# Patient Record
Sex: Male | Born: 1985 | Race: Black or African American | Hispanic: No | Marital: Single | Smoking: Former smoker
Health system: Southern US, Community
[De-identification: ages and names within clinical notes are randomized; demographics above are authoritative.]

## PROBLEM LIST (undated history)

## (undated) HISTORY — PX: OTHER SURGICAL HISTORY: SHX169

---

## 2009-02-17 ENCOUNTER — Ambulatory Visit: Payer: Self-pay | Admitting: Radiology

## 2009-02-17 ENCOUNTER — Emergency Department (HOSPITAL_BASED_OUTPATIENT_CLINIC_OR_DEPARTMENT_OTHER): Admission: EM | Admit: 2009-02-17 | Discharge: 2009-02-17 | Payer: Self-pay | Admitting: Emergency Medicine

## 2009-05-17 ENCOUNTER — Emergency Department (HOSPITAL_BASED_OUTPATIENT_CLINIC_OR_DEPARTMENT_OTHER): Admission: EM | Admit: 2009-05-17 | Discharge: 2009-05-17 | Payer: Self-pay | Admitting: Emergency Medicine

## 2009-08-10 ENCOUNTER — Emergency Department (HOSPITAL_BASED_OUTPATIENT_CLINIC_OR_DEPARTMENT_OTHER): Admission: EM | Admit: 2009-08-10 | Discharge: 2009-08-10 | Payer: Self-pay | Admitting: Emergency Medicine

## 2009-08-10 ENCOUNTER — Ambulatory Visit: Payer: Self-pay | Admitting: Radiology

## 2009-12-02 ENCOUNTER — Ambulatory Visit: Payer: Self-pay | Admitting: Diagnostic Radiology

## 2009-12-02 ENCOUNTER — Emergency Department (HOSPITAL_BASED_OUTPATIENT_CLINIC_OR_DEPARTMENT_OTHER): Admission: EM | Admit: 2009-12-02 | Discharge: 2009-12-02 | Payer: Self-pay | Admitting: Emergency Medicine

## 2011-01-10 LAB — RAPID STREP SCREEN (MED CTR MEBANE ONLY): Streptococcus, Group A Screen (Direct): NEGATIVE

## 2015-09-22 ENCOUNTER — Emergency Department (HOSPITAL_BASED_OUTPATIENT_CLINIC_OR_DEPARTMENT_OTHER)
Admission: EM | Admit: 2015-09-22 | Discharge: 2015-09-22 | Discharge status: Against Medical Advice | Payer: Self-pay | Attending: Emergency Medicine | Admitting: Emergency Medicine

## 2015-09-22 ENCOUNTER — Encounter (HOSPITAL_BASED_OUTPATIENT_CLINIC_OR_DEPARTMENT_OTHER): Payer: Self-pay | Admitting: *Deleted

## 2015-09-22 DIAGNOSIS — F17200 Nicotine dependence, unspecified, uncomplicated: Secondary | ICD-10-CM | POA: Insufficient documentation

## 2015-09-22 DIAGNOSIS — Z5321 Procedure and treatment not carried out due to patient leaving prior to being seen by health care provider: Secondary | ICD-10-CM | POA: Insufficient documentation

## 2015-09-22 LAB — URINALYSIS, ROUTINE W REFLEX MICROSCOPIC
Bilirubin Urine: NEGATIVE
Glucose, UA: NEGATIVE mg/dL
Hgb urine dipstick: NEGATIVE
KETONES UR: NEGATIVE mg/dL
LEUKOCYTES UA: NEGATIVE
NITRITE: NEGATIVE
PROTEIN: NEGATIVE mg/dL
Specific Gravity, Urine: 1.021 (ref 1.005–1.030)
pH: 6 (ref 5.0–8.0)

## 2015-09-22 NOTE — ED Notes (Signed)
Back pain x 2 days. Was seen at [facility] yesterday and told he may have a sprained back.

## 2015-09-22 NOTE — ED Notes (Signed)
Pt stated he needed to get a phone charger from his car and would come right back. Approx 15 min later he has not returned. Staff looked for him in the lobby and parking lot, and he was not present. Pt is assumed to have LWBS after triage. Pt with alert, interactive, breathing easy and unlabored, ambulated to the exit quickly with steady gait in NAD.

## 2015-09-23 NOTE — ED Provider Notes (Signed)
Patient left without being seen or evaluated.  Leta BaptistEmily Roe Nguyen, MD 09/23/15 [phone]

## 2015-11-14 ENCOUNTER — Emergency Department (HOSPITAL_BASED_OUTPATIENT_CLINIC_OR_DEPARTMENT_OTHER)
Admission: EM | Admit: 2015-11-14 | Discharge: 2015-11-14 | Disposition: A | Discharge status: Home / Self Care | Payer: Worker's Compensation | Attending: Emergency Medicine | Admitting: Emergency Medicine

## 2015-11-14 ENCOUNTER — Encounter (HOSPITAL_BASED_OUTPATIENT_CLINIC_OR_DEPARTMENT_OTHER): Payer: Self-pay | Admitting: Emergency Medicine

## 2015-11-14 ENCOUNTER — Emergency Department (HOSPITAL_BASED_OUTPATIENT_CLINIC_OR_DEPARTMENT_OTHER): Payer: Worker's Compensation

## 2015-11-14 DIAGNOSIS — S63502A Unspecified sprain of left wrist, initial encounter: Secondary | ICD-10-CM

## 2015-11-14 DIAGNOSIS — S6992XA Unspecified injury of left wrist, hand and finger(s), initial encounter: Secondary | ICD-10-CM | POA: Diagnosis present

## 2015-11-14 DIAGNOSIS — X501XXA Overexertion from prolonged static or awkward postures, initial encounter: Secondary | ICD-10-CM | POA: Diagnosis not present

## 2015-11-14 DIAGNOSIS — Y990 Civilian activity done for income or pay: Secondary | ICD-10-CM | POA: Insufficient documentation

## 2015-11-14 DIAGNOSIS — Y9289 Other specified places as the place of occurrence of the external cause: Secondary | ICD-10-CM | POA: Diagnosis not present

## 2015-11-14 DIAGNOSIS — Y9389 Activity, other specified: Secondary | ICD-10-CM | POA: Diagnosis not present

## 2015-11-14 DIAGNOSIS — F17200 Nicotine dependence, unspecified, uncomplicated: Secondary | ICD-10-CM | POA: Insufficient documentation

## 2015-11-14 DIAGNOSIS — S93401A Sprain of unspecified ligament of right ankle, initial encounter: Secondary | ICD-10-CM | POA: Diagnosis not present

## 2015-11-14 MED ORDER — NAPROXEN 500 MG PO TABS: 500.0000 mg | ORAL_TABLET | Freq: Two times a day (BID) | ORAL | Status: DC | PRN

## 2015-11-14 MED ORDER — NAPROXEN 250 MG PO TABS
500.0000 mg | ORAL_TABLET | Freq: Once | ORAL | Status: AC
  Administered 2015-11-14: 500 mg via ORAL
  Filled 2015-11-14: qty 2

## 2015-11-14 NOTE — ED Notes (Addendum)
Patient was at work and hurt his left wrist and right ankle

## 2015-11-14 NOTE — ED Provider Notes (Signed)
CSN: [id number]     Arrival date & time 12/11/2015  0028 History   First MD Initiated Contact with Patient 2015/12/11 0250     Chief Complaint  Patient presents with  . Wrist Injury     (Consider location/radiation/quality/duration/timing/severity/associated sxs/prior Treatment) HPI  This is a 30 year old male who was at work late yesterday evening. He had a low-speed collision with a cart during which his left wrist was hyperflexed and his right ankle was twisted. He is now having moderate pain in his dorsal left wrist and medial right ankle. Pain is worse with movement or palpation. There is swelling on the dorsal left wrist. There is no functional or sensory deficit.  History reviewed. No pertinent past medical history. History reviewed. No pertinent past surgical history. History reviewed. No pertinent family history. Social History  Substance Use Topics  . Smoking status: Current Some Day Smoker  . Smokeless tobacco: None  . Alcohol Use: Yes    Review of Systems  All other systems reviewed and are negative.   Allergies  Review of patient's allergies indicates no known allergies.  Home Medications   Prior to Admission medications   Not on File   BP 138/88 mmHg  Pulse 83  Temp(Src) 98.5 F (36.9 C) (Oral)  Resp 18  Ht  (1.676 m)  Wt 155 lb (70.308 kg)  BMI 25.03 kg/m2  SpO2 100%   Physical Exam  General: Well-developed, well-nourished male in no acute distress; appearance consistent with age of record HENT: normocephalic; atraumatic Eyes: Normal appearance Neck: supple; no C-spine tenderness Heart: regular rate and rhythm Lungs: Normal respiratory effort and excursion Abdomen: soft; nondistended Extremities: No deformity; full range of motion; pulses normal; mild tenderness over medial right ankle; moderate tenderness and swelling over dorsal left wrist Neurologic: Awake, alert and oriented; motor function intact in all extremities and symmetric; no facial  droop Skin: Warm and dry Psychiatric: Normal mood and affect    ED Course  Procedures (including critical care time)   MDM  Nursing notes and vitals signs, including pulse oximetry, reviewed.  Summary of this visit's results, reviewed by myself:  Labs:  No results found for this or any previous visit (from the past 24 hour(s)).  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 Lateral left wrist pain EXAM: LEFT WRIST - COMPLETE 3+ VIEW COMPARISON:  None. FINDINGS: There is no evidence of fracture or dislocation. There is no evidence of arthropathy or other focal bone abnormality. Soft tissues are unremarkable. IMPRESSION: Negative. Electronically Signed   By: Burman Nieves M.D.   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 Medial right ankle pain. EXAM: RIGHT ANKLE - COMPLETE 3+ VIEW COMPARISON:  None. FINDINGS: There is no evidence of fracture, dislocation, or joint effusion. There is no evidence of arthropathy or other focal bone abnormality. Soft tissues are unremarkable. IMPRESSION: Negative. Electronically Signed   By: Burman Nieves M.D.   On: 2015-12-11 01:10      Paula Libra, MD 12-11-15 [phone]

## 2015-11-18 ENCOUNTER — Ambulatory Visit: Payer: Worker's Compensation | Admitting: Family Medicine

## 2015-11-21 ENCOUNTER — Encounter: Payer: Self-pay | Admitting: Family Medicine

## 2015-11-21 ENCOUNTER — Ambulatory Visit (INDEPENDENT_AMBULATORY_CARE_PROVIDER_SITE_OTHER): Payer: Worker's Compensation | Admitting: Family Medicine

## 2015-11-21 VITALS — BP 125/86 | HR 82 | Ht 66.0 in | Wt 160.0 lb

## 2015-11-21 DIAGNOSIS — S93401A Sprain of unspecified ligament of right ankle, initial encounter: Secondary | ICD-10-CM

## 2015-11-21 DIAGNOSIS — S63502A Unspecified sprain of left wrist, initial encounter: Secondary | ICD-10-CM

## 2015-11-21 MED ORDER — HYDROCODONE-ACETAMINOPHEN 5-325 MG PO TABS: 1.0000 | ORAL_TABLET | Freq: Four times a day (QID) | ORAL | Status: DC | PRN

## 2015-11-21 MED ORDER — IBUPROFEN 600 MG PO TABS: 600.0000 mg | ORAL_TABLET | Freq: Four times a day (QID) | ORAL | Status: DC | PRN

## 2015-11-21 NOTE — Patient Instructions (Signed)
You have an ankle sprain. Ice the area for 15 minutes at a time, 3-4 times a day Ibuprofen  three times a day with food. Elevate above the level of your heart when possible Use laceup ankle brace to help with stability while you recover from this injury. Come out of the brace twice a day to do Up/down and alphabet exercises 2-3 sets of each. Start theraband strengthening exercises when directed (in about a week) - once a day 3 sets of 10. Consider physical therapy for strengthening and balance exercises. If not improving as expected, we may repeat x-rays or consider further testing like an MRI.  You have a wrist sprain. Ice the area 15 minutes at a time 3-4 times a day. Ibuprofen  three times a day with food. Use the wrist brace you have at all times except to wash and ice the area. Elevate above your heart as needed for swelling. Follow up with me in 2 weeks for reevaluation. Out of work in meantime.

## 2015-11-23 DIAGNOSIS — S63502A Unspecified sprain of left wrist, initial encounter: Secondary | ICD-10-CM | POA: Insufficient documentation

## 2015-11-23 DIAGNOSIS — S93401A Sprain of unspecified ligament of right ankle, initial encounter: Secondary | ICD-10-CM | POA: Insufficient documentation

## 2015-11-23 NOTE — Assessment & Plan Note (Signed)
Independently reviewed radiographs and no fracture.  Consistent with sprain.  ASO, icing, nsaids, shown home exercises to do daily.  Consider physical therapy.  F/u in 2 weeks for reevaluation.

## 2015-11-23 NOTE — Progress Notes (Signed)
PCP: No primary care provider on file.  Subjective:   HPI: Patient is a 30 y.o. male here for left wrist, right ankle pain.  Patient reports at work on 1/23 he was in between the flaps of a forklift that hit patient as he was trying to get away. Caused him to evert right ankle, hyperflex left wrist. Felt a hot sensation in left wrist dorsally. Pain level 7/10 left wrist, 9/10 right ankle - both sharp. Has been wearing wrist brace, icing, taking ibuprofen with mild benefit. No prior injuries to either area. No skin changes, fever, other complaints.  No past medical history on file.  Current Outpatient Prescriptions on File Prior to Visit  Medication Sig Dispense Refill  . naproxen (NAPROSYN) 500 MG tablet Take 1 tablet (500 mg total) by mouth 2 (two) times daily as needed (pain). 20 tablet 0   No current facility-administered medications on file prior to visit.    No past surgical history on file.  No Known Allergies  Social History   Social History  . Marital Status: Single    Spouse Name: N/A  . Number of Children: N/A  . Years of Education: N/A   Occupational History  . Not on file.   Social History Main Topics  . Smoking status: Current Some Day Smoker  . Smokeless tobacco: Not on file  . Alcohol Use: 0.0 oz/week    0 Standard drinks or equivalent per week  . Drug Use: No  . Sexual Activity: Not on file   Other Topics Concern  . Not on file   Social History Narrative    No family history on file.  BP 125/86 mmHg  Pulse 82  Ht  (1.676 m)  Wt 160 lb (72.576 kg)  BMI 25.84 kg/m2  Review of Systems: See HPI above.    Objective:  Physical Exam:  Gen: NAD  Left wrist: No gross deformity, swelling, bruising. TTP dorsally over wrist joint - no focal radius, ulna, snuffbox, other wrist/hand tenderness. Mild limitation extension of wrist.  FROM otherwise. Strength 5/5 with finger extension, abduction, thumb opposition, wrist  flexion/extension. NVI distally.  Right wrist: FROM without pain.  Right ankle: Mild swelling.  No gross deformity, ecchymoses FROM with 5/5 strength all directions. TTP over deltoid ligament.  No fibular head, malleolar, base 5th, navicular, other tenderness. Negative ant drawer.  Trace talar tilt and reverse talar tilt (pain with the latter).   Negative syndesmotic compression. Thompsons test negative. NV intact distally.    Left ankle: FROM without pain.  Assessment & Plan:  1. Left wrist sprain - independently reviewed radiographs and no evidence abnormalities.  Exam not consistent with an occult fracture either.  Icing, nsaids, wrist brace.  F/u in 2 weeks.  2. Right ankle sprain - Independently reviewed radiographs and no fracture.  Consistent with sprain.  ASO, icing, nsaids, shown home exercises to do daily.  Consider physical therapy.  F/u in 2 weeks for reevaluation.

## 2015-11-23 NOTE — Assessment & Plan Note (Signed)
independently reviewed radiographs and no evidence abnormalities.  Exam not consistent with an occult fracture either.  Icing, nsaids, wrist brace.  F/u in 2 weeks.

## 2015-12-05 ENCOUNTER — Ambulatory Visit (INDEPENDENT_AMBULATORY_CARE_PROVIDER_SITE_OTHER): Payer: Worker's Compensation | Admitting: Family Medicine

## 2015-12-05 ENCOUNTER — Encounter: Payer: Self-pay | Admitting: Family Medicine

## 2015-12-05 VITALS — BP 153/89 | HR 80 | Ht 66.0 in | Wt 170.0 lb

## 2015-12-05 DIAGNOSIS — S93401D Sprain of unspecified ligament of right ankle, subsequent encounter: Secondary | ICD-10-CM | POA: Diagnosis not present

## 2015-12-05 DIAGNOSIS — S63502D Unspecified sprain of left wrist, subsequent encounter: Secondary | ICD-10-CM

## 2015-12-05 NOTE — Patient Instructions (Signed)
You have ankle and wrist sprains. Continue with icing, ibuprofen, home exercises. Use braces on both areas. We will write for physical therapy and submit this to workers comp. Follow up with me in 1 month. If still not improving would consider MRIs. Light duty as written if available.

## 2015-12-06 NOTE — Addendum Note (Signed)
Addended by: Kathi Simpers F on: 12/06/2015 04:56 PM   Modules accepted: Orders

## 2015-12-06 NOTE — Assessment & Plan Note (Signed)
independently reviewed radiographs and no fracture.  Consistent with sprain.  Continue ASO, icing, nsaids.  Add physical therapy to home exercises.  F/u in 1 month.  Light duty written.

## 2015-12-06 NOTE — Assessment & Plan Note (Signed)
independently reviewed radiographs and no evidence abnormalities.  Exam not consistent with an occult fracture either.  Continue with brace, icing, nsaids.  Start physical therapy.  F/u in 1 month.

## 2015-12-06 NOTE — Addendum Note (Signed)
Addended by: Kathi Simpers F on: 12/06/2015 04:59 PM   Modules accepted: Orders

## 2015-12-06 NOTE — Progress Notes (Signed)
PCP: No primary care provider on file.  Subjective:   HPI: Patient is a 30 y.o. male here for left wrist, right ankle pain.  1/30: Patient reports at work on 1/23 he was in between the flaps of a forklift that hit patient as he was trying to get away. Caused him to evert right ankle, hyperflex left wrist. Felt a hot sensation in left wrist dorsally. Pain level 7/10 left wrist, 9/10 right ankle - both sharp. Has been wearing wrist brace, icing, taking ibuprofen with mild benefit. No prior injuries to either area. No skin changes, fever, other complaints.  2/13: Patient returns with mild improvement since last visit. Pain level down to 5/10 wrist, 7/10 ankle, both still sharp - medial right ankle, dorsal wrist. Wearing ankle braces for both. Doing ankle exercises. Worse with ambulation. No skin changes, fever, other complaints.  No past medical history on file.   Current Outpatient Prescriptions on File Prior to Visit  Medication Sig Dispense Refill  . HYDROcodone-acetaminophen (NORCO) 5-325 MG tablet Take 1 tablet by mouth every 6 (six) hours as needed for moderate pain. 30 tablet 0  . ibuprofen (ADVIL,MOTRIN) 600 MG tablet Take 1 tablet (600 mg total) by mouth every 6 (six) hours as needed. 90 tablet 1  . naproxen (NAPROSYN) 500 MG tablet Take 1 tablet (500 mg total) by mouth 2 (two) times daily as needed (pain). 20 tablet 0   No current facility-administered medications on file prior to visit.    No past surgical history on file.  No Known Allergies  Social History   Social History  . Marital Status: Single    Spouse Name: N/A  . Number of Children: N/A  . Years of Education: N/A   Occupational History  . Not on file.   Social History Main Topics  . Smoking status: Current Some Day Smoker  . Smokeless tobacco: Not on file  . Alcohol Use: 0.0 oz/week    0 Standard drinks or equivalent per week  . Drug Use: No  . Sexual Activity: Not on file   Other Topics  Concern  . Not on file   Social History Narrative    No family history on file.  BP 153/89 mmHg  Pulse 80  Ht  (1.676 m)  Wt 170 lb (77.111 kg)  BMI 27.45 kg/m2  Review of Systems: See HPI above.    Objective:  Physical Exam:  Gen: NAD  Left wrist: No gross deformity, swelling, bruising. TTP dorsally over wrist joint - no focal radius, ulna, snuffbox, other wrist/hand tenderness. FROM. Strength 5/5 with finger extension, abduction, thumb opposition, wrist flexion/extension. NVI distally.  Right wrist: FROM without pain.  Right ankle: No swelling.  No gross deformity, ecchymoses FROM with 5/5 strength all directions. TTP over deltoid ligament.  No fibular head, malleolar, base 5th, navicular, other tenderness. Negative ant drawer.  Trace talar tilt and reverse talar tilt (pain with the latter).   Negative syndesmotic compression. Thompsons test negative. NV intact distally.    Left ankle: FROM without pain.  Assessment & Plan:  1. Left wrist sprain - independently reviewed radiographs and no evidence abnormalities.  Exam not consistent with an occult fracture either.  Continue with brace, icing, nsaids.  Start physical therapy.  F/u in 1 month.  2. Right ankle sprain - Independently reviewed radiographs and no fracture.  Consistent with sprain.  Continue ASO, icing, nsaids.  Add physical therapy to home exercises.  F/u in 1 month.  Light duty  written.

## 2016-01-02 ENCOUNTER — Ambulatory Visit (INDEPENDENT_AMBULATORY_CARE_PROVIDER_SITE_OTHER): Payer: Worker's Compensation | Admitting: Family Medicine

## 2016-01-02 ENCOUNTER — Encounter: Payer: Self-pay | Admitting: Family Medicine

## 2016-01-02 VITALS — BP 136/79 | HR 73 | Ht 66.0 in | Wt 180.0 lb

## 2016-01-02 DIAGNOSIS — S93401D Sprain of unspecified ligament of right ankle, subsequent encounter: Secondary | ICD-10-CM

## 2016-01-02 DIAGNOSIS — S63502D Unspecified sprain of left wrist, subsequent encounter: Secondary | ICD-10-CM | POA: Diagnosis not present

## 2016-01-02 NOTE — Patient Instructions (Signed)
Continue with physical therapy, home exercises, icing, ibuprofen. Continue light duty but we are lifting the lifting restrictions - try to stop using the wrist brace as well at this point. Follow up with me in 1 month. I want to give physical therapy more of a try before considering an MRI on this ankle.

## 2016-01-04 NOTE — Assessment & Plan Note (Signed)
previously independently reviewed radiographs and no evidence abnormalities.  Improving well with bracing, ibuprofen, icing, PT.

## 2016-01-04 NOTE — Assessment & Plan Note (Signed)
previously independently reviewed radiographs and no fracture.  Consistent with sprain.  Continue ASO, icing, nsaids.  Would like to give PT more of a chance to help - no red flags on exam to warrant MRI yet - will consider in a month if still not improving to assess for an osteochondral defect.

## 2016-01-04 NOTE — Progress Notes (Signed)
PCP: No primary care provider on file.  Subjective:   HPI: Patient is a 30 y.o. male here for left wrist, right ankle pain.  1/30: Patient reports at work on 1/23 he was in between the flaps of a forklift that hit patient as he was trying to get away. Caused him to evert right ankle, hyperflex left wrist. Felt a hot sensation in left wrist dorsally. Pain level 7/10 left wrist, 9/10 right ankle - both sharp. Has been wearing wrist brace, icing, taking ibuprofen with mild benefit. No prior injuries to either area. No skin changes, fever, other complaints.  2/13: Patient returns with mild improvement since last visit. Pain level down to 5/10 wrist, 7/10 ankle, both still sharp - medial right ankle, dorsal wrist. Wearing ankle braces for both. Doing ankle exercises. Worse with ambulation. No skin changes, fever, other complaints.  3/13: Patient reports his wrist feels over 75% improved from last visit. Ankle still with fairly severe pain across anterior - medial to lateral, sharp. Can still get up to 7/10 level. Swelling has improved. Doing physical therapy, wearing ankle brace, taking ibuprofen. Worse with ambulation.  No past medical history on file.   Current Outpatient Prescriptions on File Prior to Visit  Medication Sig Dispense Refill  . HYDROcodone-acetaminophen (NORCO) 5-325 MG tablet Take 1 tablet by mouth every 6 (six) hours as needed for moderate pain. 30 tablet 0  . ibuprofen (ADVIL,MOTRIN) 600 MG tablet Take 1 tablet (600 mg total) by mouth every 6 (six) hours as needed. 90 tablet 1   No current facility-administered medications on file prior to visit.    No past surgical history on file.  No Known Allergies  Social History   Social History  . Marital Status: Single    Spouse Name: N/A  . Number of Children: N/A  . Years of Education: N/A   Occupational History  . Not on file.   Social History Main Topics  . Smoking status: Current Some Day Smoker   . Smokeless tobacco: Not on file  . Alcohol Use: 0.0 oz/week    0 Standard drinks or equivalent per week  . Drug Use: No  . Sexual Activity: Not on file   Other Topics Concern  . Not on file   Social History Narrative    No family history on file.  BP 136/79 mmHg  Pulse 73  Ht  (1.676 m)  Wt 180 lb (81.647 kg)  BMI 29.07 kg/m2  Review of Systems: See HPI above.    Objective:  Physical Exam:  Gen: NAD  Left wrist: No gross deformity, swelling, bruising. Minimal TTP dorsally over wrist joint - no focal radius, ulna, snuffbox, other wrist/hand tenderness. FROM. Strength 5/5 with finger extension, abduction, thumb opposition, wrist flexion/extension. NVI distally.  Right wrist: FROM without pain.  Right ankle: No swelling.  No gross deformity, ecchymoses FROM with 5/5 strength all directions. TTP over deltoid ligament, ATFL.  No fibular head, malleolar, base 5th, navicular, other tenderness. Negative ant drawer.  Trace talar tilt and reverse talar tilt.   Negative syndesmotic compression. Thompsons test negative. NV intact distally.    Left ankle: FROM without pain.  Assessment & Plan:  1. Left wrist sprain - previously independently reviewed radiographs and no evidence abnormalities.  Improving well with bracing, ibuprofen, icing, PT.    2. Right ankle sprain - previously independently reviewed radiographs and no fracture.  Consistent with sprain.  Continue ASO, icing, nsaids.  Would like to give PT more  of a chance to help - no red flags on exam to warrant MRI yet - will consider in a month if still not improving to assess for an osteochondral defect.

## 2016-02-01 ENCOUNTER — Encounter: Payer: Self-pay | Admitting: Family Medicine

## 2016-02-01 ENCOUNTER — Ambulatory Visit (INDEPENDENT_AMBULATORY_CARE_PROVIDER_SITE_OTHER): Payer: Worker's Compensation | Admitting: Family Medicine

## 2016-02-01 VITALS — BP 145/92 | HR 72 | Ht 66.0 in | Wt 184.4 lb

## 2016-02-01 DIAGNOSIS — S63502D Unspecified sprain of left wrist, subsequent encounter: Secondary | ICD-10-CM

## 2016-02-01 DIAGNOSIS — S93401D Sprain of unspecified ligament of right ankle, subsequent encounter: Secondary | ICD-10-CM | POA: Diagnosis not present

## 2016-02-01 NOTE — Patient Instructions (Signed)
Return to full duty without restrictions. But follow up with me in 6 weeks for reevaluation. MRI of the ankle is a consideration if you're not improving to assess for an osteochondral defect.  You have sacroiliac dysfunction on the right side. Consider chiropractic care for this. Do the stretches noted in the handout. An injection is a consideration but is usually last line treatment if still not improving.

## 2016-02-02 NOTE — Progress Notes (Signed)
PCP: No primary care provider on file.  Subjective:   HPI: Patient is a 30 y.o. male here for left wrist, right ankle pain.  1/30: Patient reports at work on 1/23 he was in between the flaps of a forklift that hit patient as he was trying to get away. Caused him to evert right ankle, hyperflex left wrist. Felt a hot sensation in left wrist dorsally. Pain level 7/10 left wrist, 9/10 right ankle - both sharp. Has been wearing wrist brace, icing, taking ibuprofen with mild benefit. No prior injuries to either area. No skin changes, fever, other complaints.  2/13: Patient returns with mild improvement since last visit. Pain level down to 5/10 wrist, 7/10 ankle, both still sharp - medial right ankle, dorsal wrist. Wearing ankle braces for both. Doing ankle exercises. Worse with ambulation. No skin changes, fever, other complaints.  3/13: Patient reports his wrist feels over 75% improved from last visit. Ankle still with fairly severe pain across anterior - medial to lateral, sharp. Can still get up to 7/10 level. Swelling has improved. Doing physical therapy, wearing ankle brace, taking ibuprofen. Worse with ambulation.  4/12: Patient reports he continues to slowly improve. Pain still comes on in right ankle at end of work day, with a lot of standing and walking. Pain 0/10 at rest. Not needing medication currently. Done with physical therapy. Pain is anterior ankle joint when this comes on. No skin changes, numbness.  No past medical history on file.   Current Outpatient Prescriptions on File Prior to Visit  Medication Sig Dispense Refill  . HYDROcodone-acetaminophen (NORCO) 5-325 MG tablet Take 1 tablet by mouth every 6 (six) hours as needed for moderate pain. 30 tablet 0  . ibuprofen (ADVIL,MOTRIN) 600 MG tablet Take 1 tablet (600 mg total) by mouth every 6 (six) hours as needed. 90 tablet 1   No current facility-administered medications on file prior to visit.    No  past surgical history on file.  No Known Allergies  Social History   Social History  . Marital Status: Single    Spouse Name: N/A  . Number of Children: N/A  . Years of Education: N/A   Occupational History  . Not on file.   Social History Main Topics  . Smoking status: Current Some Day Smoker  . Smokeless tobacco: Not on file  . Alcohol Use: 0.0 oz/week    0 Standard drinks or equivalent per week  . Drug Use: No  . Sexual Activity: Not on file   Other Topics Concern  . Not on file   Social History Narrative    No family history on file.  BP 145/92 mmHg  Pulse 72  Ht  (1.676 m)  Wt 184 lb 6.4 oz (83.643 kg)  BMI 29.78 kg/m2  Review of Systems: See HPI above.    Objective:  Physical Exam:  Gen: NAD  Right ankle: No swelling.  No gross deformity, ecchymoses FROM with 5/5 strength all directions. TTP over ant ankle joint.  No fibular head, malleolar, base 5th, navicular, other tenderness. Negative ant drawer.  Trace talar tilt and reverse talar tilt.   Negative syndesmotic compression. Thompsons test negative. NV intact distally.    Left ankle: FROM without pain.  Assessment & Plan:  1. Left wrist sprain - Healed at this point.    2. Right ankle sprain - previously independently reviewed radiographs and no fracture.  Consistent with sprain.  Continues to slowly improve.  He will return to full  duty at this time.  Plan to reevaluate him in 6 weeks to ensure he's not having problems.  As we discussed today would consider MRI to assess for osteochondral defect if he doesn't continue to improve.

## 2016-02-02 NOTE — Assessment & Plan Note (Signed)
previously independently reviewed radiographs and no fracture.  Consistent with sprain.  Continues to slowly improve.  He will return to full duty at this time.  Plan to reevaluate him in 6 weeks to ensure he's not having problems.  As we discussed today would consider MRI to assess for osteochondral defect if he doesn't continue to improve.

## 2016-02-02 NOTE — Assessment & Plan Note (Signed)
Healed at this point.

## 2016-03-08 ENCOUNTER — Emergency Department (HOSPITAL_BASED_OUTPATIENT_CLINIC_OR_DEPARTMENT_OTHER): Payer: Worker's Compensation

## 2016-03-08 ENCOUNTER — Emergency Department (HOSPITAL_BASED_OUTPATIENT_CLINIC_OR_DEPARTMENT_OTHER)
Admission: EM | Admit: 2016-03-08 | Discharge: 2016-03-08 | Disposition: A | Discharge status: Home / Self Care | Payer: Worker's Compensation | Attending: Emergency Medicine | Admitting: Emergency Medicine

## 2016-03-08 ENCOUNTER — Encounter (HOSPITAL_BASED_OUTPATIENT_CLINIC_OR_DEPARTMENT_OTHER): Payer: Self-pay | Admitting: *Deleted

## 2016-03-08 DIAGNOSIS — M25571 Pain in right ankle and joints of right foot: Secondary | ICD-10-CM | POA: Diagnosis present

## 2016-03-08 DIAGNOSIS — F17200 Nicotine dependence, unspecified, uncomplicated: Secondary | ICD-10-CM | POA: Diagnosis not present

## 2016-03-08 NOTE — ED Notes (Signed)
Pt reports injury to right ankle at work, was treated and seen by sports medicine, pt recently returned to work and injury has been reaggravated. Complaining of lateral anterior pain to right foot, + cms, decreased rom

## 2016-03-08 NOTE — ED Notes (Signed)
Ankle injury to his right ankle 4 months ago. Pain continues.

## 2016-03-08 NOTE — ED Notes (Signed)
Pt made aware to return if symptoms worsen or if any life threatening symptoms occur.   

## 2016-03-08 NOTE — ED Provider Notes (Signed)
CSN: [id number]     Arrival date & time 03/08/16  1732 History   First MD Initiated Contact with Patient 03/08/16 1746     Chief Complaint  Patient presents with  . Ankle Injury     (Consider location/radiation/quality/duration/timing/severity/associated sxs/prior Treatment) HPI  Samuel Schwartz is a(n) 30 y.o. male who presents to the ED with cc of R ankle pain. The patient  C/o pain in the medial side of his ankle. He had an Eversion injury of the ankle back in January and has been under the care of Dr. Pearletha ForgeHudnall . He states that he has had increased activity at work and is having pain and swelling overt the past few day. Pain is worse with weightbearing. And inversion of the ankle. He denies any new injuries to the ankle. He has been taking naproxen, which the right. Some relief. He denies any numbness or tingling in the foot.  History reviewed. No pertinent past medical history. History reviewed. No pertinent past surgical history. No family history on file. Social History  Substance Use Topics  . Smoking status: Current Some Day Smoker  . Smokeless tobacco: None  . Alcohol Use: 0.0 oz/week    0 Standard drinks or equivalent per week    Review of Systems Ten systems reviewed and are negative for acute change, except as noted in the HPI.     Allergies  Review of patient's allergies indicates no known allergies.  Home Medications   Prior to Admission medications   Medication Sig Start Date End Date Taking? Authorizing Provider  HYDROcodone-acetaminophen (NORCO) 5-325 MG tablet Take 1 tablet by mouth every 6 (six) hours as needed for moderate pain. 11/21/15   Lenda KelpShane R Hudnall, MD  ibuprofen (ADVIL,MOTRIN) 600 MG tablet Take 1 tablet (600 mg total) by mouth every 6 (six) hours as needed. 11/21/15   Lenda KelpShane R Hudnall, MD   BP 126/74 mmHg  Pulse 67  Temp(Src) 98.6 F (37 C) (Oral)  Resp 18  Ht 5\' 6"  (1.676 m)  Wt 81.647 kg  BMI 29.07 kg/m2  SpO2 98% Physical Exam   Constitutional: He appears well-developed and well-nourished. No distress.  HENT:  Head: Normocephalic and atraumatic.  Eyes: Conjunctivae are normal. No scleral icterus.  Neck: Normal range of motion. Neck supple.  Cardiovascular: Normal rate, regular rhythm and normal heart sounds.   Pulmonary/Chest: Effort normal and breath sounds normal. No respiratory distress.  Abdominal: Soft. There is no tenderness.  Musculoskeletal: He exhibits no edema.  From of the ankle . Full strength. Point tenderness and swelling over the Navicular bone. No redness or signs of infection.  Neurological: He is alert.  Skin: Skin is warm and dry. He is not diaphoretic.  Psychiatric: His behavior is normal.  Nursing note and vitals reviewed.   ED Course  Procedures (including critical care time) Labs Review Labs Reviewed - No data to display  Imaging Review No results found. I have personally reviewed and evaluated these images and lab results as part of my medical decision-making.   EKG Interpretation None      MDM   Final diagnoses:  None    7:09 PM BP 126/74 mmHg  Pulse 67  Temp(Src) 98.6 F (37 C) (Oral)  Resp 18  Ht 5\' 6"  (1.676 m)  Wt 81.647 kg  BMI 29.07 kg/m2  SpO2 98% Patient with negative xray.  no signs of infection. Advise ice, elevation and wear ankle brace. May add tylenol. Has follow up with Dr. Pearletha ForgeHudnall Wednesday  5/24. Discussed return precautions. Appears safe for discharge.      Arthor Captain, PA-C 03/08/16 1912  Lyndal Pulley, MD 03/09/16 [phone]

## 2016-03-08 NOTE — Discharge Instructions (Signed)
Ankle Pain Ankle pain is a common symptom. The bones, cartilage, tendons, and muscles of the ankle joint perform a lot of work each day. The ankle joint holds your body weight and allows you to move around. Ankle pain can occur on either side or back of 1 or both ankles. Ankle pain may be sharp and burning or dull and aching. There may be tenderness, stiffness, redness, or warmth around the ankle. The pain occurs more often when a person walks or puts pressure on the ankle. CAUSES  There are many reasons ankle pain can develop. It is important to work with your caregiver to identify the cause since many conditions can impact the bones, cartilage, muscles, and tendons. Causes for ankle pain include:  Injury, including a break (fracture), sprain, or strain often due to a fall, sports, or a high-impact activity.  Swelling (inflammation) of a tendon (tendonitis).  Achilles tendon rupture.  Ankle instability after repeated sprains and strains.  Poor foot alignment.  Pressure on a nerve (tarsal tunnel syndrome).  Arthritis in the ankle or the lining of the ankle.  Crystal formation in the ankle (gout or pseudogout). DIAGNOSIS  A diagnosis is based on your medical history, your symptoms, results of your physical exam, and results of diagnostic tests. Diagnostic tests may include X-ray exams or a computerized magnetic scan (magnetic resonance imaging, MRI). TREATMENT  Treatment will depend on the cause of your ankle pain and may include:  Keeping pressure off the ankle and limiting activities.  Using crutches or other walking support (a cane or brace).  Using rest, ice, compression, and elevation.  Participating in physical therapy or home exercises.  Wearing shoe inserts or special shoes.  Losing weight.  Taking medications to reduce pain or swelling or receiving an injection.  Undergoing surgery. HOME CARE INSTRUCTIONS   Only take over-the-counter or prescription medicines for  pain, discomfort, or fever as directed by your caregiver.  Put ice on the injured area.  Put ice in a plastic bag.  Place a towel between your skin and the bag.  Leave the ice on for 15-20 minutes at a time, 03-04 times a day.  Keep your leg raised (elevated) when possible to lessen swelling.  Avoid activities that cause ankle pain.  Follow specific exercises as directed by your caregiver.  Record how often you have ankle pain, the location of the pain, and what it feels like. This information may be helpful to you and your caregiver.  Ask your caregiver about returning to work or sports and whether you should drive.  Follow up with your caregiver for further examination, therapy, or testing as directed. SEEK MEDICAL CARE IF:   Pain or swelling continues or worsens beyond 1 week.  You have an oral temperature above 102 F (38.9 C).  You are feeling unwell or have chills.  You are having an increasingly difficult time with walking.  You have loss of sensation or other new symptoms.  You have questions or concerns. MAKE SURE YOU:   Understand these instructions.  Will watch your condition.  Will get help right away if you are not doing well or get worse.   This information is not intended to replace advice given to you by your health care provider. Make sure you discuss any questions you have with your health care provider.   Document Released: 03/28/2010 Document Revised: 12/31/2011 Document Reviewed: 05/10/2015 Elsevier Interactive Patient Education 2016 [place].  Cryotherapy Cryotherapy means treatment with cold. Ice or  gel packs can be used to reduce both pain and swelling. Ice is the most helpful within the first 24 to 48 hours after an injury or flare-up from overusing a muscle or joint. Sprains, strains, spasms, burning pain, shooting pain, and aches can all be eased with ice. Ice can also be used when recovering from surgery. Ice is effective, has very few  side effects, and is safe for most people to use. PRECAUTIONS  Ice is not a safe treatment option for people with:  Raynaud phenomenon. This is a condition affecting small blood vessels in the extremities. Exposure to cold may cause your problems to return.  Cold hypersensitivity. There are many forms of cold hypersensitivity, including:  Cold urticaria. Red, itchy hives appear on the skin when the tissues begin to warm after being iced.  Cold erythema. This is a red, itchy rash caused by exposure to cold.  Cold hemoglobinuria. Red blood cells break down when the tissues begin to warm after being iced. The hemoglobin that carry oxygen are passed into the urine because they cannot combine with blood proteins fast enough.  Numbness or altered sensitivity in the area being iced. If you have any of the following conditions, do not use ice until you have discussed cryotherapy with your caregiver:  Heart conditions, such as arrhythmia, angina, or chronic heart disease.  High blood pressure.  Healing wounds or open skin in the area being iced.  Current infections.  Rheumatoid arthritis.  Poor circulation.  Diabetes. Ice slows the blood flow in the region it is applied. This is beneficial when trying to stop inflamed tissues from spreading irritating chemicals to surrounding tissues. However, if you expose your skin to cold temperatures for too long or without the proper protection, you can damage your skin or nerves. Watch for signs of skin damage due to cold. HOME CARE INSTRUCTIONS Follow these tips to use ice and cold packs safely.  Place a dry or damp towel between the ice and skin. A damp towel will cool the skin more quickly, so you may need to shorten the time that the ice is used.  For a more rapid response, add gentle compression to the ice.  Ice for no more than 10 to 20 minutes at a time. The bonier the area you are icing, the less time it will take to get the benefits of  ice.  Check your skin after 5 minutes to make sure there are no signs of a poor response to cold or skin damage.  Rest 20 minutes or more between uses.  Once your skin is numb, you can end your treatment. You can test numbness by very lightly touching your skin. The touch should be so light that you do not see the skin dimple from the pressure of your fingertip. When using ice, most people will feel these normal sensations in this order: cold, burning, aching, and numbness.  Do not use ice on someone who cannot communicate their responses to pain, such as small children or people with dementia. HOW TO MAKE AN ICE PACK Ice packs are the most common way to use ice therapy. Other methods include ice massage, ice baths, and cryosprays. Muscle creams that cause a cold, tingly feeling do not offer the same benefits that ice offers and should not be used as a substitute unless recommended by your caregiver. To make an ice pack, do one of the following:  Place crushed ice or a bag of frozen vegetables in a sealable  plastic bag. Squeeze out the excess air. Place this bag inside another plastic bag. Slide the bag into a pillowcase or place a damp towel between your skin and the bag.  Mix 3 parts water with 1 part rubbing alcohol. Freeze the mixture in a sealable plastic bag. When you remove the mixture from the freezer, it will be slushy. Squeeze out the excess air. Place this bag inside another plastic bag. Slide the bag into a pillowcase or place a damp towel between your skin and the bag. SEEK MEDICAL CARE IF:  You develop white spots on your skin. This may give the skin a blotchy (mottled) appearance.  Your skin turns blue or pale.  Your skin becomes waxy or hard.  Your swelling gets worse. MAKE SURE YOU:   Understand these instructions.  Will watch your condition.  Will get help right away if you are not doing well or get worse.   This information is not intended to replace advice given  to you by your health care provider. Make sure you discuss any questions you have with your health care provider.   Document Released: 06/04/2011 Document Revised: 10/29/2014 Document Reviewed: 06/04/2011 Elsevier Interactive Patient Education [employer]

## 2016-03-12 ENCOUNTER — Ambulatory Visit: Payer: Worker's Compensation | Admitting: Family Medicine

## 2016-03-14 ENCOUNTER — Ambulatory Visit: Payer: Self-pay | Admitting: Family Medicine

## 2016-03-14 ENCOUNTER — Ambulatory Visit (INDEPENDENT_AMBULATORY_CARE_PROVIDER_SITE_OTHER): Payer: Worker's Compensation | Admitting: Family Medicine

## 2016-03-14 ENCOUNTER — Encounter: Payer: Self-pay | Admitting: Family Medicine

## 2016-03-14 VITALS — BP 121/82 | HR 59 | Ht 66.0 in | Wt 188.0 lb

## 2016-03-14 DIAGNOSIS — M25571 Pain in right ankle and joints of right foot: Secondary | ICD-10-CM

## 2016-03-14 DIAGNOSIS — S63502D Unspecified sprain of left wrist, subsequent encounter: Secondary | ICD-10-CM

## 2016-03-14 DIAGNOSIS — S93401D Sprain of unspecified ligament of right ankle, subsequent encounter: Secondary | ICD-10-CM | POA: Diagnosis not present

## 2016-03-14 NOTE — Patient Instructions (Signed)
Given the continued problems you're having with this ankle I would recommend we go ahead with an MRI to assess for an osteochondral defect. Get something like dr. Jari Sportsmanscholls active series, our green insoles, superfeet and wear regularly. Avoid flat shoes or barefoot walking. Icing as needed 15 minutes at a time 3-4 times a day. Ibuprofen or aleve as needed. Continue with full duty. The MRI results will determine if we need to do something further (injection, surgical referral) or if this is reassuring if we can release you with the above instructions.

## 2016-03-15 NOTE — Assessment & Plan Note (Signed)
Healed at this point.

## 2016-03-15 NOTE — Progress Notes (Addendum)
PCP: No primary care provider on file.  Subjective:   HPI: Patient is a 30 y.o. male here for left wrist, right ankle pain.  1/30: Patient reports at work on 1/23 he was in between the flaps of a forklift that hit patient as he was trying to get away. Caused him to evert right ankle, hyperflex left wrist. Felt a hot sensation in left wrist dorsally. Pain level 7/10 left wrist, 9/10 right ankle - both sharp. Has been wearing wrist brace, icing, taking ibuprofen with mild benefit. No prior injuries to either area. No skin changes, fever, other complaints.  2/13: Patient returns with mild improvement since last visit. Pain level down to 5/10 wrist, 7/10 ankle, both still sharp - medial right ankle, dorsal wrist. Wearing ankle braces for both. Doing ankle exercises. Worse with ambulation. No skin changes, fever, other complaints.  3/13: Patient reports his wrist feels over 75% improved from last visit. Ankle still with fairly severe pain across anterior - medial to lateral, sharp. Can still get up to 7/10 level. Swelling has improved. Doing physical therapy, wearing ankle brace, taking ibuprofen. Worse with ambulation.  4/12: Patient reports he continues to slowly improve. Pain still comes on in right ankle at end of work day, with a lot of standing and walking. Pain 0/10 at rest. Not needing medication currently. Done with physical therapy. Pain is anterior ankle joint when this comes on. No skin changes, numbness.  5/24: Patient reports his wrist is ok - rare tingling. He had to go to the emergency department recently because of right ankle pain. Pain is 0/10 at rest but when walking can get fairly severe. Worse at end of work day. Improved with rest. Doing home exercises. No skin changes, numbness.  No past medical history on file.   Current Outpatient Prescriptions on File Prior to Visit  Medication Sig Dispense Refill  . HYDROcodone-acetaminophen (NORCO) 5-325  MG tablet Take 1 tablet by mouth every 6 (six) hours as needed for moderate pain. 30 tablet 0  . ibuprofen (ADVIL,MOTRIN) 600 MG tablet Take 1 tablet (600 mg total) by mouth every 6 (six) hours as needed. 90 tablet 1   No current facility-administered medications on file prior to visit.    No past surgical history on file.  No Known Allergies  Social History   Social History  . Marital Status: Single    Spouse Name: N/A  . Number of Children: N/A  . Years of Education: N/A   Occupational History  . Not on file.   Social History Main Topics  . Smoking status: Current Some Day Smoker  . Smokeless tobacco: Not on file  . Alcohol Use: 0.0 oz/week    0 Standard drinks or equivalent per week  . Drug Use: No  . Sexual Activity: Not on file   Other Topics Concern  . Not on file   Social History Narrative    No family history on file.  BP 121/82 mmHg  Pulse 59  Ht 5\' 6"  (1.676 m)  Wt 188 lb (85.276 kg)  BMI 30.36 kg/m2  Review of Systems: See HPI above.    Objective:  Physical Exam:  Gen: NAD  Right ankle: No swelling.  No gross deformity, ecchymoses.  Mild pronation. FROM with 5/5 strength all directions. TTP over ant ankle joint.  No fibular head, malleolar, base 5th, navicular, other tenderness. Negative ant drawer.  Trace talar tilt and reverse talar tilt.   Negative syndesmotic compression. Thompsons test negative. NV  intact distally.    Left ankle: FROM without pain.  Assessment & Plan:  1. Left wrist sprain - Healed at this point.    2. Right ankle injury - still suspect sprain as primary issue but with anterior ankle pain that can be severe - advised at this point we move ahead with MRI to assess for osteochondral defect.  Continue with home exercises.  Continue full duty.  Tylenol or ibuprofen as needed.  Addendum:  MRI reviewed and discussed with patient.  No evidence osteochondral defect or any other abnormalities.  Ligamentous structures now  intact.  Will release from care at this time - is already at full duty.  No impairment as a result of his injuries.

## 2016-03-15 NOTE — Assessment & Plan Note (Signed)
still suspect sprain as primary issue but with anterior ankle pain that can be severe - advised at this point we move ahead with MRI to assess for osteochondral defect.  Continue with home exercises.  Continue full duty.  Tylenol or ibuprofen as needed.

## 2016-04-05 ENCOUNTER — Encounter: Payer: Self-pay | Admitting: Family Medicine

## 2016-04-19 ENCOUNTER — Ambulatory Visit (INDEPENDENT_AMBULATORY_CARE_PROVIDER_SITE_OTHER): Payer: Worker's Compensation | Admitting: Family Medicine

## 2016-04-19 ENCOUNTER — Encounter: Payer: Self-pay | Admitting: Family Medicine

## 2016-04-19 VITALS — BP 129/79 | HR 69 | Ht 66.0 in | Wt 180.0 lb

## 2016-04-19 DIAGNOSIS — S93401D Sprain of unspecified ligament of right ankle, subsequent encounter: Secondary | ICD-10-CM

## 2016-04-19 NOTE — Assessment & Plan Note (Signed)
2/2 sprain initially - some residual posttraumatic pain but no abnormalities on MRI.  Continue home exercises, ASO as needed.  Released from care at this time with no impairment.

## 2016-04-19 NOTE — Progress Notes (Signed)
PCP: No primary care provider on file.  Subjective:   HPI: Patient is a 30 y.o. male here for left wrist, right ankle pain.  1/30: Patient reports at work on 1/23 he was in between the flaps of a forklift that hit patient as he was trying to get away. Caused him to evert right ankle, hyperflex left wrist. Felt a hot sensation in left wrist dorsally. Pain level 7/10 left wrist, 9/10 right ankle - both sharp. Has been wearing wrist brace, icing, taking ibuprofen with mild benefit. No prior injuries to either area. No skin changes, fever, other complaints.  2/13: Patient returns with mild improvement since last visit. Pain level down to 5/10 wrist, 7/10 ankle, both still sharp - medial right ankle, dorsal wrist. Wearing ankle braces for both. Doing ankle exercises. Worse with ambulation. No skin changes, fever, other complaints.  3/13: Patient reports his wrist feels over 75% improved from last visit. Ankle still with fairly severe pain across anterior - medial to lateral, sharp. Can still get up to 7/10 level. Swelling has improved. Doing physical therapy, wearing ankle brace, taking ibuprofen. Worse with ambulation.  4/12: Patient reports he continues to slowly improve. Pain still comes on in right ankle at end of work day, with a lot of standing and walking. Pain 0/10 at rest. Not needing medication currently. Done with physical therapy. Pain is anterior ankle joint when this comes on. No skin changes, numbness.  5/24: Patient reports his wrist is ok - rare tingling. He had to go to the emergency department recently because of right ankle pain. Pain is 0/10 at rest but when walking can get fairly severe. Worse at end of work day. Improved with rest. Doing home exercises. No skin changes, numbness.  6/29: Patient returns for final visit. Pain up to 4/10 at most at end of work day anterior ankle. MRI was reassuring. Wearing ASO at work. No skin changes,  numbness. No past medical history on file.   Current Outpatient Prescriptions on File Prior to Visit  Medication Sig Dispense Refill  . HYDROcodone-acetaminophen (NORCO) 5-325 MG tablet Take 1 tablet by mouth every 6 (six) hours as needed for moderate pain. 30 tablet 0  . ibuprofen (ADVIL,MOTRIN) 600 MG tablet Take 1 tablet (600 mg total) by mouth every 6 (six) hours as needed. 90 tablet 1   No current facility-administered medications on file prior to visit.    No past surgical history on file.  No Known Allergies  Social History   Social History  . Marital Status: Single    Spouse Name: N/A  . Number of Children: N/A  . Years of Education: N/A   Occupational History  . Not on file.   Social History Main Topics  . Smoking status: Current Some Day Smoker  . Smokeless tobacco: Not on file  . Alcohol Use: 0.0 oz/week    0 Standard drinks or equivalent per week  . Drug Use: No  . Sexual Activity: Not on file   Other Topics Concern  . Not on file   Social History Narrative    No family history on file.  BP 129/79 mmHg  Pulse 69  Ht 5\' 6"  (1.676 m)  Wt 180 lb (81.647 kg)  BMI 29.07 kg/m2  Review of Systems: See HPI above.    Objective:  Physical Exam:  Gen: NAD  Right ankle: No swelling.  No gross deformity, ecchymoses.  Mild pronation. FROM with 5/5 strength all directions. TTP over ant ankle joint  only. Negative ant drawer, talar tilt. NV intact distally.    Left ankle: FROM without pain.  Assessment & Plan:  1. Left wrist sprain - Healed.    2. Right ankle injury - 2/2 sprain initially - some residual posttraumatic pain but no abnormalities on MRI.  Continue home exercises, ASO as needed.  Released from care at this time with no impairment.

## 2016-07-15 ENCOUNTER — Emergency Department (HOSPITAL_BASED_OUTPATIENT_CLINIC_OR_DEPARTMENT_OTHER): Payer: Self-pay

## 2016-07-15 ENCOUNTER — Emergency Department (HOSPITAL_BASED_OUTPATIENT_CLINIC_OR_DEPARTMENT_OTHER)
Admission: EM | Admit: 2016-07-15 | Discharge: 2016-07-15 | Disposition: A | Discharge status: Home / Self Care | Payer: Self-pay | Attending: Emergency Medicine | Admitting: Emergency Medicine

## 2016-07-15 ENCOUNTER — Encounter (HOSPITAL_BASED_OUTPATIENT_CLINIC_OR_DEPARTMENT_OTHER): Payer: Self-pay | Admitting: Emergency Medicine

## 2016-07-15 DIAGNOSIS — J069 Acute upper respiratory infection, unspecified: Secondary | ICD-10-CM | POA: Insufficient documentation

## 2016-07-15 DIAGNOSIS — F17210 Nicotine dependence, cigarettes, uncomplicated: Secondary | ICD-10-CM | POA: Insufficient documentation

## 2016-07-15 MED ORDER — PREDNISONE 10 MG (21) PO TBPK: 10.0000 mg | ORAL_TABLET | Freq: Every day | ORAL | 0 refills | Status: DC

## 2016-07-15 MED ORDER — BENZONATATE 100 MG PO CAPS: 100.0000 mg | ORAL_CAPSULE | Freq: Three times a day (TID) | ORAL | 0 refills | Status: DC

## 2016-07-15 NOTE — ED Provider Notes (Signed)
[facility]-EMERGENCY DEPT Provider Note   CSN: [id number] Arrival date & time: 07/15/16  1740 By signing my name below, I, Samuel Schwartz, attest that this documentation has been prepared under the direction and in the presence of non-physician practitioner, Harolyn RutherfordShawn Gunnison Chahal, PA-C  Electronically Signed: Levon HedgerElizabeth Schwartz, Scribe. 07/15/2016. 7:13 PM.   History   Chief Complaint Chief Complaint  Patient presents with  . Cough    HPI Samuel BaileyBrian A Schwartz is a 30 y.o. male who presents to the Emergency Department complaining of intermittent, worsening cough for a few days. Pt states the cough is productive with yellow sputum. He notes associated lightheadedness and headache. He denies any fever, nausea, vomiting, abdominal pain, shortness of breath, or any other complaints.   The history is provided by the patient. No language interpreter was used.    History reviewed. No pertinent past medical history.  Patient Active Problem List   Diagnosis Date Noted  . Left wrist sprain 11/23/2015  . Right ankle sprain 11/23/2015    History reviewed. No pertinent surgical history.  Home Medications    Prior to Admission medications   Medication Sig Start Date End Date Taking? Authorizing Provider  benzonatate (TESSALON) 100 MG capsule Take 1 capsule (100 mg total) by mouth every 8 (eight) hours. 07/15/16   Margart Zemanek C Lisandro Meggett, PA-C  HYDROcodone-acetaminophen (NORCO) 5-325 MG tablet Take 1 tablet by mouth every 6 (six) hours as needed for moderate pain. 11/21/15   Lenda KelpShane R Hudnall, MD  ibuprofen (ADVIL,MOTRIN) 600 MG tablet Take 1 tablet (600 mg total) by mouth every 6 (six) hours as needed. 11/21/15   Lenda KelpShane R Hudnall, MD  predniSONE (STERAPRED UNI-PAK 21 TAB) 10 MG (21) TBPK tablet Take 1 tablet (10 mg total) by mouth daily. Take 6 tabs day 1, 5 tabs day 2, 4 tabs day 3, 3 tabs day 4, 2 tabs day 5, and 1 tab on day 6. 07/15/16   Anselm PancoastShawn C Cheyne Bungert, PA-C    Family History History reviewed. No pertinent family history.  Social  History Social History  Substance Use Topics  . Smoking status: Current Some Day Smoker    Types: Cigarettes  . Smokeless tobacco: Never Used  . Alcohol use 0.0 oz/week     Comment: occ     Allergies   Review of patient's allergies indicates no known allergies.   Review of Systems Review of Systems  Constitutional: Negative for fever.  Respiratory: Positive for cough. Negative for shortness of breath.   Gastrointestinal: Negative for abdominal pain, nausea and vomiting.  Neurological: Positive for light-headedness (resolved) and headaches (resolved).  All other systems reviewed and are negative.   Physical Exam Updated Vital Signs BP 114/75 (BP Location: Left Arm)   Pulse 61   Temp 98.2 F (36.8 C) (Oral)   Resp 18   Ht 5\' 6"  (1.676 m)   Wt 175 lb (79.4 kg)   SpO2 99%   BMI 28.25 kg/m   Physical Exam  Constitutional: He is oriented to person, place, and time. He appears well-developed and well-nourished. No distress.  HENT:  Head: Normocephalic and atraumatic.  Eyes: Conjunctivae are normal.  Neck: Normal range of motion. Neck supple.  Cardiovascular: Normal rate, regular rhythm, normal heart sounds and intact distal pulses.   Pulmonary/Chest: Effort normal and breath sounds normal. No respiratory distress.  Abdominal: Soft. He exhibits no distension. There is no tenderness. There is no guarding.  Musculoskeletal: He exhibits no edema or tenderness.  Lymphadenopathy:    He has no  cervical adenopathy.  Neurological: He is alert and oriented to person, place, and time.  Skin: Skin is warm and dry. He is not diaphoretic.  Psychiatric: He has a normal mood and affect. His behavior is normal.  Nursing note and vitals reviewed.  ED Treatments / Results  DIAGNOSTIC STUDIES:  Oxygen Saturation is 99% on RA, normal by my interpretation.    COORDINATION OF CARE:  7:10 PM Discussed treatment plan with pt at bedside and pt agreed to plan.  Labs (all labs ordered are  listed, but only abnormal results are displayed) Labs Reviewed - No data to display  EKG  EKG Interpretation None       Radiology Dg Chest 2 View  Result Date: 07/15/2016 CLINICAL DATA:  Chest pain for 2 days with intermittent cough. EXAM: CHEST  2 VIEW COMPARISON:  PA and lateral chest 12/02/2009. FINDINGS: The lungs are clear. Heart size is normal. There is no pneumothorax or pleural effusion. No bony abnormality. IMPRESSION: Negative chest. Electronically Signed   By: Drusilla Kanner M.D.   On: 07/15/2016 18:55    Procedures Procedures (including critical care time)  Medications Ordered in ED Medications - No data to display   Initial Impression / Assessment and Plan / ED Course  I have reviewed the triage vital signs and the nursing notes.  Pertinent labs & imaging results that were available during my care of the patient were reviewed by me and considered in my medical decision making (see chart for details).  Clinical Course    Patient with a cough, congestion, and headache. Clear chest x-ray. Suspect viral illness. Symptomatic care and return precautions discussed. Patient voiced understanding of these instructions and is comfortable with discharge.   Vitals:   07/15/16 1753 07/15/16 1754 07/15/16 1924  BP:  114/75 124/79  Pulse:  61 65  Resp:  18 18  Temp:  98.2 F (36.8 C)   TempSrc:  Oral   SpO2:  99% 100%  Weight: 79.4 kg    Height: 5\' 6"  (1.676 m)       Final Clinical Impressions(s) / ED Diagnoses   Final diagnoses:  URI (upper respiratory infection)   New Prescriptions Discharge Medication List as of 07/15/2016  7:19 PM    START taking these medications   Details  benzonatate (TESSALON) 100 MG capsule Take 1 capsule (100 mg total) by mouth every 8 (eight) hours., Starting Sun 07/15/2016, Print    predniSONE (STERAPRED UNI-PAK 21 TAB) 10 MG (21) TBPK tablet Take 1 tablet (10 mg total) by mouth daily. Take 6 tabs day 1, 5 tabs day 2, 4 tabs day 3,  3 tabs day 4, 2 tabs day 5, and 1 tab on day 6., Starting Sun 07/15/2016, Print       I personally performed the services described in this documentation, which was scribed in my presence. The recorded information has been reviewed and is accurate.    Anselm Pancoast, PA-C 07/16/16 1916    Benjiman Core, MD 07/18/16 [phone]

## 2016-07-15 NOTE — Discharge Instructions (Signed)
There were no abnormalities on chest x-ray. Your symptoms are consistent with a viral illness. Viruses do not require antibiotics. Treatment is symptomatic care. Drink plenty of fluids and get plenty of rest. You should be drinking at least a one half to one liter of water an hour to stay hydrated. Ibuprofen, Naproxen, or Tylenol for pain or fever. Tessalon for cough. Plain Mucinex may help relieve congestion.  Follow-up with her primary care provider for continued symptoms. Return to the ED should symptoms worsen. Symptoms can last for 7-10 days.

## 2016-07-15 NOTE — ED Triage Notes (Signed)
Patient with cough for a couple days, states he now has chest discomfort when coughing, and an episode of lightheadedness when coughing. Patient denies fever or chills. Cough is productive with yellow mucous.

## 2016-09-16 ENCOUNTER — Encounter (HOSPITAL_BASED_OUTPATIENT_CLINIC_OR_DEPARTMENT_OTHER): Payer: Self-pay | Admitting: *Deleted

## 2016-09-16 ENCOUNTER — Emergency Department (HOSPITAL_BASED_OUTPATIENT_CLINIC_OR_DEPARTMENT_OTHER)
Admission: EM | Admit: 2016-09-16 | Discharge: 2016-09-16 | Disposition: A | Discharge status: Home / Self Care | Payer: Worker's Compensation | Attending: Emergency Medicine | Admitting: Emergency Medicine

## 2016-09-16 DIAGNOSIS — F17210 Nicotine dependence, cigarettes, uncomplicated: Secondary | ICD-10-CM | POA: Diagnosis not present

## 2016-09-16 DIAGNOSIS — M7989 Other specified soft tissue disorders: Secondary | ICD-10-CM | POA: Diagnosis present

## 2016-09-16 DIAGNOSIS — M25571 Pain in right ankle and joints of right foot: Secondary | ICD-10-CM | POA: Diagnosis not present

## 2016-09-16 MED ORDER — MELOXICAM 15 MG PO TABS: 15.0000 mg | ORAL_TABLET | Freq: Every day | ORAL | 0 refills | Status: AC

## 2016-09-16 NOTE — Discharge Instructions (Signed)
Please read and follow all provided instructions.  Your diagnoses today include:  1. Acute right ankle pain     Tests performed today include:  Vital signs. See below for your results today.   Medications prescribed:   Meloxicam - anti-inflammatory pain medication  You have been prescribed an anti-inflammatory medication or NSAID. Take with food. Do not take aspirin, ibuprofen, or naproxen if taking this medication. Take smallest effective dose for the shortest duration needed for your pain. Stop taking if you experience stomach pain or vomiting.   Take any prescribed medications only as directed.  Home care instructions:   Follow any educational materials contained in this packet  Follow R.I.C.E. Protocol:  R - rest your injury   I  - use ice on injury without applying directly to skin  C - compress injury with bandage or splint  E - elevate the injury as much as possible  Follow-up instructions: Please follow-up with your primary care provider or the provided orthopedic (bone specialist) if you continue to have significant pain or trouble walking in 1 week.   Return instructions:   Please return if your toes are numb or tingling, appear gray or blue, or you have severe pain (also elevate leg and loosen splint or wrap)  Please return to the Emergency Department if you experience worsening symptoms.   Please return if you have any other emergent concerns.  Additional Information:  Your vital signs today were: BP 128/77 (BP Location: Left Arm)    Pulse 74    Temp 97.9 F (36.6 C) (Oral)    Resp 20    Ht 5\' 6"  (1.676 m)    Wt 77.1 kg    SpO2 100%    BMI 27.44 kg/m  If your blood pressure (BP) was elevated above 135/85 this visit, please have this repeated by your doctor within one month. --------------

## 2016-09-16 NOTE — ED Triage Notes (Signed)
Patient states he had a work related injury to his right ankle in Jan 2017.  States he has had pain off and on since injury.  States this morning he woke up with swelling in the ankle and increased pain.  No new injury.

## 2016-09-16 NOTE — ED Provider Notes (Signed)
[facility]   CSN: [id number] Arrival date & time: 09/16/16  1638  By signing my name below, I, Vista Minkobert Ross, attest that this documentation has been prepared under the direction and in the presence of Renne CriglerJoshua Amor Packard PA-C  Electronically Signed: Vista Minkobert Ross, ED Scribe. 09/16/16. 6:59 PM.  History   Chief Complaint Chief Complaint  Patient presents with  . Ankle Pain    right    HPI HPI Comments: Sudie BaileyBrian A Sharps is a 30 y.o. male who presents to the Emergency Department complaining of gradual onset right ankle pain and swelling that started this morning. Pt had a work related injury to his right ankle in January 2017. His foot got stuck under a pallet jack and twisted the ankle. He was dx with a bad ankle sprain, no fractures. He had physical therapy and was followed by Dr. Pearletha ForgeHudnall. He states that he has had intermittent pain in the ankle since this occurred. Pt woke up this morning with some swelling and increased pain in the ankle. This pain felt similar to previous pain but worse. He has taken Tylenol, Aleve and Advil with no relief of symptoms. No acute injury. Sensation intact.    The history is provided by the patient. No language interpreter was used.    History reviewed. No pertinent past medical history.  Patient Active Problem List   Diagnosis Date Noted  . Left wrist sprain 11/23/2015  . Right ankle sprain 11/23/2015    History reviewed. No pertinent surgical history.   Home Medications    Prior to Admission medications   Medication Sig Start Date End Date Taking? Authorizing Provider  benzonatate (TESSALON) 100 MG capsule Take 1 capsule (100 mg total) by mouth every 8 (eight) hours. 07/15/16   Shawn C Joy, PA-C  HYDROcodone-acetaminophen (NORCO) 5-325 MG tablet Take 1 tablet by mouth every 6 (six) hours as needed for moderate pain. 11/21/15   Lenda KelpShane R Hudnall, MD  ibuprofen (ADVIL,MOTRIN) 600 MG tablet Take 1 tablet (600 mg total) by  mouth every 6 (six) hours as needed. 11/21/15   Lenda KelpShane R Hudnall, MD  predniSONE (STERAPRED UNI-PAK 21 TAB) 10 MG (21) TBPK tablet Take 1 tablet (10 mg total) by mouth daily. Take 6 tabs day 1, 5 tabs day 2, 4 tabs day 3, 3 tabs day 4, 2 tabs day 5, and 1 tab on day 6. 07/15/16   Shawn C Joy, PA-C   Family History No family history on file.  Social History Social History  Substance Use Topics  . Smoking status: Current Some Day Smoker    Types: Cigarettes  . Smokeless tobacco: Never Used  . Alcohol use 0.0 oz/week     Comment: occ    Allergies   Patient has no known allergies.   Review of Systems Review of Systems  Constitutional: Negative for activity change and fever.  Musculoskeletal: Positive for arthralgias (right ankle) and joint swelling (right ankle). Negative for back pain, gait problem and neck pain.  Skin: Negative for wound.  Neurological: Negative for weakness and numbness.    Physical Exam Updated Vital Signs BP 128/77 (BP Location: Left Arm)   Pulse 74   Temp 97.9 F (36.6 C) (Oral)   Resp 20   Ht 5\' 6"  (1.676 m)   Wt 170 lb (77.1 kg)   SpO2 100%   BMI 27.44 kg/m   Physical Exam  Constitutional: He appears well-developed and well-nourished. No distress.  HENT:  Head: Normocephalic and atraumatic.  Eyes: Conjunctivae are normal.  Neck: Normal range of motion. Neck supple.  Cardiovascular: Normal pulses.  Exam reveals no decreased pulses.   Pulmonary/Chest: Effort normal.  Musculoskeletal: He exhibits tenderness. He exhibits no edema.       Right ankle: No tenderness. No lateral malleolus and no medial malleolus tenderness found. Achilles tendon exhibits no pain and no defect.       Right lower leg: Normal.       Right foot: There is tenderness and bony tenderness. There is normal range of motion and no swelling.       Feet:  Neurological: He is alert. No sensory deficit.  Motor, sensation, and vascular distal to the injury is fully intact.   Skin:  Skin is warm and dry. He is not diaphoretic.  Psychiatric: He has a normal mood and affect. Judgment normal.  Nursing note and vitals reviewed. Medications - No data to display  ED Treatments / Results  DIAGNOSTIC STUDIES: Oxygen Saturation is 100% on RA, normal by my interpretation.  COORDINATION OF CARE: 6:31 PM-Will order rx anti-inflammatory. Follow up with orthopedic doctor if symptoms persist. Discussed treatment plan with pt at bedside and pt agreed to plan.   Procedures Procedures   Initial Impression / Assessment and Plan / ED Course  I have reviewed the triage vital signs and the nursing notes.  Pertinent labs & imaging results that were available during my care of the patient were reviewed by me and considered in my medical decision making (see chart for details).  Clinical Course    Vital signs reviewed and are as follows: Vitals:   09/16/16 1703 09/16/16 1902  BP: 128/77 117/78  Pulse: 74 77  Resp: 20 16  Temp: 97.9 F (36.6 C)    Patient was counseled on RICE protocol and told to rest injury, use ice for no longer than 15 minutes every hour, compress the area, and elevate above the level of their heart as much as possible to reduce swelling. Questions answered. Patient verbalized understanding.    Encouraged follow-up with his orthopedist if symptoms do not improve.  Final Clinical Impressions(s) / ED Diagnoses   Final diagnoses:  Acute right ankle pain   Patient with right ankle pain starting spontaneously without new injury. Patient has had a previous injury in this area. This has been followed by sports medicine. Doubt new fracture. Will place on prescription anti-inflammatories and continue conservative measures. Sports medicine follow-up as needed. Lower extremity is neurovascularly intact today.  New Prescriptions Discharge Medication List as of 09/16/2016  6:53 PM    START taking these medications   Details  meloxicam (MOBIC) 15 MG tablet Take 1  tablet (15 mg total) by mouth daily., Starting Sun 09/16/2016, Until Wed 09/26/2016, Print      I personally performed the services described in this documentation, which was scribed in my presence. The recorded information has been reviewed and is accurate.     Renne CriglerJoshua Demetrio Leighty, PA-C 09/17/16 0151    Nira ConnPedro Eduardo Cardama, MD 09/20/16 [phone]

## 2016-11-26 ENCOUNTER — Encounter (HOSPITAL_BASED_OUTPATIENT_CLINIC_OR_DEPARTMENT_OTHER): Payer: Self-pay | Admitting: *Deleted

## 2016-11-26 ENCOUNTER — Emergency Department (HOSPITAL_BASED_OUTPATIENT_CLINIC_OR_DEPARTMENT_OTHER): Payer: Worker's Compensation

## 2016-11-26 DIAGNOSIS — Y929 Unspecified place or not applicable: Secondary | ICD-10-CM | POA: Insufficient documentation

## 2016-11-26 DIAGNOSIS — S9781XA Crushing injury of right foot, initial encounter: Secondary | ICD-10-CM | POA: Diagnosis present

## 2016-11-26 DIAGNOSIS — Z87891 Personal history of nicotine dependence: Secondary | ICD-10-CM | POA: Diagnosis not present

## 2016-11-26 DIAGNOSIS — Y939 Activity, unspecified: Secondary | ICD-10-CM | POA: Diagnosis not present

## 2016-11-26 DIAGNOSIS — Y990 Civilian activity done for income or pay: Secondary | ICD-10-CM | POA: Insufficient documentation

## 2016-11-26 DIAGNOSIS — W230XXA Caught, crushed, jammed, or pinched between moving objects, initial encounter: Secondary | ICD-10-CM | POA: Insufficient documentation

## 2016-11-26 NOTE — ED Triage Notes (Signed)
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

## 2016-11-26 NOTE — ED Notes (Signed)
pts supervisor from work states post accident  drug screen need

## 2016-11-27 ENCOUNTER — Emergency Department (HOSPITAL_BASED_OUTPATIENT_CLINIC_OR_DEPARTMENT_OTHER)
Admission: EM | Admit: 2016-11-27 | Discharge: 2016-11-27 | Disposition: A | Discharge status: Home / Self Care | Payer: Worker's Compensation | Attending: Emergency Medicine | Admitting: Emergency Medicine

## 2016-11-27 DIAGNOSIS — S9781XA Crushing injury of right foot, initial encounter: Secondary | ICD-10-CM

## 2016-11-27 MED ORDER — NAPROXEN 250 MG PO TABS
500.0000 mg | ORAL_TABLET | Freq: Once | ORAL | Status: AC
  Administered 2016-11-27: 500 mg via ORAL
  Filled 2016-11-27: qty 2

## 2016-11-27 MED ORDER — NAPROXEN 500 MG PO TABS: ORAL_TABLET | ORAL | 0 refills | Status: DC

## 2016-11-27 MED ORDER — HYDROCODONE-ACETAMINOPHEN 5-325 MG PO TABS: 1.0000 | ORAL_TABLET | ORAL | 0 refills | Status: DC | PRN

## 2016-11-27 NOTE — ED Notes (Signed)
Fork lift ran over rt foot,  C/o pain to outer rt foot  No deformity noted

## 2016-11-27 NOTE — ED Provider Notes (Signed)
[facility] Provider Note: Samuel DellJ. Lane Brenon Antosh, MD, FACEP  CSN: [id number] MRN: [id number] ARRIVAL: 11/26/16 at 2317 ROOM: [facility]   CHIEF COMPLAINT  Foot Injury   HISTORY OF PRESENT ILLNESS  Samuel Schwartz is a 31 y.o. male who had his right ankle run over by a pallet jack at work yesterday evening about 11:30 PM. He is having moderate to severe pain overlying the base of the right fifth metatarsal. There is some mild associated swelling. Pain is worse with attempted weightbearing. He denies other injury. There is no numbness or weakness of the foot distally.   History reviewed. No pertinent past medical history.  History reviewed. No pertinent surgical history.  History reviewed. No pertinent family history.  Social History  Substance Use Topics  . Smoking status: Former Smoker    Types: Cigarettes  . Smokeless tobacco: Never Used  . Alcohol use 0.0 oz/week     Comment: occ    Prior to Admission medications   Medication Sig Start Date End Date Taking? Authorizing Provider  HYDROcodone-acetaminophen (NORCO) 5-325 MG tablet Take 1 tablet by mouth every 4 (four) hours as needed for severe pain. 11/27/16   Paula LibraJohn Janiel Crisostomo, MD  naproxen (NAPROSYN) 500 MG tablet Take one tablet twice daily as needed for pain. 11/27/16   Paula LibraJohn Clariece Roesler, MD    Allergies Patient has no known allergies.   REVIEW OF SYSTEMS  Negative except as noted here or in the History of Present Illness.   PHYSICAL EXAMINATION  Initial Vital Signs Blood pressure 115/80, pulse 85, temperature 98.2 F (36.8 C), resp. rate 16, height 5\' 6"  (1.676 m), weight 165 lb (74.8 kg), SpO2 100 %.  Examination General: Well-developed, well-nourished male in no acute distress; appearance consistent with age of record HENT: normocephalic; atraumatic Eyes: pupils equal, round and reactive to light; extraocular muscles intact Neck: supple Heart: regular rate and rhythm Lungs: clear to auscultation  bilaterally Abdomen: soft; nondistended; nontender; bowel sounds present Extremities: No deformity; full range of motion; tenderness over the base the right fifth metatarsal with mild swelling but no ecchymosis, right foot distally neurovascularly intact with intact tendon function Neurologic: Awake, alert and oriented; motor function intact in all extremities and symmetric; no facial droop Skin: Warm and dry Psychiatric: Normal mood and affect   RESULTS  Summary of this visit's results, reviewed by myself:   EKG Interpretation  Date/Time:    Ventricular Rate:    PR Interval:    QRS Duration:   QT Interval:    QTC Calculation:   R Axis:     Text Interpretation:        Laboratory Studies: No results found for this or any previous visit (from the past 24 hour(s)). Imaging Studies: Dg Foot Complete Right  Result Date: 11/26/2016 CLINICAL DATA:  Right foot run over by fork lift, lateral pain EXAM: RIGHT FOOT COMPLETE - 3+ VIEW COMPARISON:  None. FINDINGS: There is no evidence of fracture or dislocation. There is no evidence of arthropathy or other focal bone abnormality. Soft tissues are unremarkable. IMPRESSION: Negative. Electronically Signed   By: Jasmine PangKim  Fujinaga M.D.   On: 11/26/2016 23:51    ED COURSE  Nursing notes and initial vitals signs, including pulse oximetry, reviewed.  Vitals:   11/26/16 2324 11/26/16 2326  BP:  115/80  Pulse:  85  Resp:  16  Temp:  98.2 F (36.8 C)  SpO2:  100%  Weight: 165 lb (74.8 kg)   Height: 5\' 6"  (1.676 m)  PROCEDURES    ED DIAGNOSES     ICD-9-CM ICD-10-CM   1. Crush injury of right foot, initial encounter 928.20 S97.81XA        Paula Libra, MD 11/27/16 [phone]

## 2016-12-03 ENCOUNTER — Telehealth: Payer: Self-pay | Admitting: Family Medicine

## 2016-12-03 ENCOUNTER — Ambulatory Visit (INDEPENDENT_AMBULATORY_CARE_PROVIDER_SITE_OTHER): Payer: Worker's Compensation | Admitting: Family Medicine

## 2016-12-03 ENCOUNTER — Encounter: Payer: Self-pay | Admitting: Family Medicine

## 2016-12-03 DIAGNOSIS — S99921A Unspecified injury of right foot, initial encounter: Secondary | ICD-10-CM | POA: Diagnosis not present

## 2016-12-03 NOTE — Patient Instructions (Signed)
You have a foot sprain and contusion. Your x-rays and ultrasound are reassuring. Icing 15 minutes at a time 3-4 times a day. Ibuprofen 600mg  three times a day with food OR aleve 2 tabs twice a day with food for pain and inflammation. Activities, walking as tolerated (you're not doing damage by walking or working on this). These typically take 4-6 weeks to resolve. Follow up with me in 1 month for reevaluation.

## 2016-12-03 NOTE — Telephone Encounter (Signed)
Letter printed and placed up front.  

## 2016-12-03 NOTE — Telephone Encounter (Signed)
He can work full duty without restrictions except use of the brace.

## 2016-12-04 DIAGNOSIS — S99921A Unspecified injury of right foot, initial encounter: Secondary | ICD-10-CM | POA: Insufficient documentation

## 2016-12-04 NOTE — Progress Notes (Signed)
PCP: No primary care provider on file.  Subjective:   HPI: Patient is a 31 y.o. male here for right foot injury.  1/30: Patient reports at work on 1/23 he was in between the flaps of a forklift that hit patient as he was trying to get away. Caused him to evert right ankle, hyperflex left wrist. Felt a hot sensation in left wrist dorsally. Pain level 7/10 left wrist, 9/10 right ankle - both sharp. Has been wearing wrist brace, icing, taking ibuprofen with mild benefit. No prior injuries to either area. No skin changes, fever, other complaints.  2/13: Patient returns with mild improvement since last visit. Pain level down to 5/10 wrist, 7/10 ankle, both still sharp - medial right ankle, dorsal wrist. Wearing ankle braces for both. Doing ankle exercises. Worse with ambulation. No skin changes, fever, other complaints.  3/13: Patient reports his wrist feels over 75% improved from last visit. Ankle still with fairly severe pain across anterior - medial to lateral, sharp. Can still get up to 7/10 level. Swelling has improved. Doing physical therapy, wearing ankle brace, taking ibuprofen. Worse with ambulation.  4/12: Patient reports he continues to slowly improve. Pain still comes on in right ankle at end of work day, with a lot of standing and walking. Pain 0/10 at rest. Not needing medication currently. Done with physical therapy. Pain is anterior ankle joint when this comes on. No skin changes, numbness.  5/24: Patient reports his wrist is ok - rare tingling. He had to go to the emergency department recently because of right ankle pain. Pain is 0/10 at rest but when walking can get fairly severe. Worse at end of work day. Improved with rest. Doing home exercises. No skin changes, numbness.  04/19/16: Patient returns for final visit. Pain up to 4/10 at most at end of work day anterior ankle. MRI was reassuring. Wearing ASO at work. No skin changes,  numbness.  12/03/16: Patient returns with new right foot injury. He states on 2/5 he was walking backwards with pallet jack when he tripped and caused right foot to get stuck under the pallet jack. + swelling. Pain level up to 7/10, sharp dorsal foot Has been taking naproxen and hydrocodone. No skin changes, numbness.  No past medical history on file.   Current Outpatient Prescriptions on File Prior to Visit  Medication Sig Dispense Refill  . HYDROcodone-acetaminophen (NORCO) 5-325 MG tablet Take 1 tablet by mouth every 4 (four) hours as needed for severe pain. 6 tablet 0  . naproxen (NAPROSYN) 500 MG tablet Take one tablet twice daily as needed for pain. 15 tablet 0   No current facility-administered medications on file prior to visit.     No past surgical history on file.  No Known Allergies  Social History   Social History  . Marital status: Single    Spouse name: N/A  . Number of children: N/A  . Years of education: N/A   Occupational History  . Not on file.   Social History Main Topics  . Smoking status: Former Smoker    Types: Cigarettes  . Smokeless tobacco: Never Used  . Alcohol use 0.0 oz/week     Comment: occ  . Drug use: No  . Sexual activity: Not on file   Other Topics Concern  . Not on file   Social History Narrative  . No narrative on file    No family history on file.  BP 124/77   Pulse 61   Ht 5'  6" (1.676 m)   Wt 166 lb (75.3 kg)   BMI 26.79 kg/m   Review of Systems: See HPI above.    Objective:  Physical Exam:  Gen: NAD  Right ankle/foot: No swelling.  No gross deformity, ecchymoses.  Mild pronation. FROM with 5/5 strength all directions. TTP over proximal 3-5th metatarsals, anterolateral ankle. Negative ant drawer, talar tilt. NV intact distally.    Left ankle: FROM without pain.  MSK u/s Right foot/ankle: no evidence cortical irregularity, edema overlying cortices, or neovascularity of metatarsals, cuneiforms, cuboid.    Assessment & Plan:  1. Right foot injury - Independently reviewed radiographs.  Performed and reviewed ultrasound.  All reassuring.  2/2 contusion, mild sprain.  Icing, ibuprofen or aleve.  May take up to 4-6 weeks.  Activities, walking as tolerated.  No restrictions at work.  F/u in 1 month.

## 2016-12-04 NOTE — Assessment & Plan Note (Signed)
Independently reviewed radiographs.  Performed and reviewed ultrasound.  All reassuring.  2/2 contusion, mild sprain.  Icing, ibuprofen or aleve.  May take up to 4-6 weeks.  Activities, walking as tolerated.  No restrictions at work.  F/u in 1 month.

## 2017-04-05 ENCOUNTER — Emergency Department (HOSPITAL_BASED_OUTPATIENT_CLINIC_OR_DEPARTMENT_OTHER)
Admission: EM | Admit: 2017-04-05 | Discharge: 2017-04-05 | Disposition: A | Discharge status: Home / Self Care | Payer: Self-pay | Attending: Physician Assistant | Admitting: Physician Assistant

## 2017-04-05 ENCOUNTER — Encounter (HOSPITAL_BASED_OUTPATIENT_CLINIC_OR_DEPARTMENT_OTHER): Payer: Self-pay | Admitting: Emergency Medicine

## 2017-04-05 DIAGNOSIS — K640 First degree hemorrhoids: Secondary | ICD-10-CM | POA: Insufficient documentation

## 2017-04-05 DIAGNOSIS — Z87891 Personal history of nicotine dependence: Secondary | ICD-10-CM | POA: Insufficient documentation

## 2017-04-05 MED ORDER — DOCUSATE SODIUM 100 MG PO CAPS: 100.0000 mg | ORAL_CAPSULE | Freq: Two times a day (BID) | ORAL | 0 refills | Status: DC

## 2017-04-05 MED ORDER — LIDOCAINE HCL (PF) 1 % IJ SOLN: 30.0000 mL | Freq: Once | INTRAMUSCULAR | Status: DC

## 2017-04-05 NOTE — ED Triage Notes (Signed)
Pt has potential abscess to top of gluteal fold.  Pt noticed it this am in the shower.  No known fever or drainage.

## 2017-04-05 NOTE — ED Provider Notes (Signed)
  [facility]   CSN: [id number] Arrival date & time: 04/05/17  [id number]     History   Chief Complaint Chief Complaint  Patient presents with  . Abscess    HPI Samuel Schwartz is a 31 y.o. male.  HPI  Patient is a 31 year old male presenting with pain in his rectum that started today during showering after work. Patient has been having a hard stools. Patient noticed pain when stooling. No blood.  No fevers no nausea vomiting or other symptoms.  No past medical history on file.  Patient Active Problem List   Diagnosis Date Noted  . Right foot injury, initial encounter 12/04/2016  . Left wrist sprain 11/23/2015  . Right ankle sprain 11/23/2015    No past surgical history on file.     Home Medications    Prior to Admission medications   Not on File    Family History No family history on file.  Social History Social History  Substance Use Topics  . Smoking status: Former Smoker    Types: Cigarettes  . Smokeless tobacco: Never Used  . Alcohol use 0.0 oz/week     Comment: occ     Allergies   Patient has no known allergies.   Review of Systems Review of Systems  Constitutional: Negative for activity change.  Respiratory: Negative for shortness of breath.   Cardiovascular: Negative for chest pain.  Gastrointestinal: Negative for abdominal pain.     Physical Exam Updated Vital Signs BP 109/70 (BP Location: Left Arm)   Pulse 72   Temp 98.2 F (36.8 C) (Oral)   Resp 16   Ht 5\' 6"  (1.676 m)   Wt 77.1 kg (170 lb)   SpO2 100%   BMI 27.44 kg/m   Physical Exam  Constitutional: He is oriented to person, place, and time. He appears well-nourished.  HENT:  Head: Normocephalic.  Eyes: Conjunctivae are normal.  Cardiovascular: Normal rate.   Pulmonary/Chest: Effort normal.  Genitourinary:  Genitourinary Comments: External hemorrhoid noted on exam. Nonthrombosed.  Neurological: He is oriented to person, place, and time.    Skin: Skin is warm and dry. He is not diaphoretic.  Psychiatric: He has a normal mood and affect. His behavior is normal.     ED Treatments / Results  Labs (all labs ordered are listed, but only abnormal results are displayed) Labs Reviewed - No data to display  EKG  EKG Interpretation None       Radiology No results found.  Procedures Procedures (including critical care time)  Medications Ordered in ED Medications  lidocaine (PF) (XYLOCAINE) 1 % injection 30 mL (not administered)     Initial Impression / Assessment and Plan / ED Course  I have reviewed the triage vital signs and the nursing notes.  Pertinent labs & imaging results that were available during my care of the patient were reviewed by me and considered in my medical decision making (see chart for details).     Patient 31 year old male here with hemorrhoid. Discussion had with patient about sitz baths, preparrin H, and soften stools.  Final Clinical Impressions(s) / ED Diagnoses   Final diagnoses:  None    New Prescriptions New Prescriptions   No medications on file     Abelino DerrickMackuen, Eyal Greenhaw Lyn, MD 04/05/17 1029

## 2017-04-05 NOTE — ED Notes (Signed)
I&D kit at bedside.

## 2017-04-05 NOTE — Discharge Instructions (Signed)
Things to get at the pharmacy to help with your discomfort.  1. Peparation H 2. Sitz Bath 3. Stool softener (perscribed or over the counter)

## 2017-05-29 ENCOUNTER — Emergency Department (HOSPITAL_BASED_OUTPATIENT_CLINIC_OR_DEPARTMENT_OTHER)
Admission: EM | Admit: 2017-05-29 | Discharge: 2017-05-29 | Disposition: A | Discharge status: Home / Self Care | Payer: Self-pay | Attending: Emergency Medicine | Admitting: Emergency Medicine

## 2017-05-29 ENCOUNTER — Encounter (HOSPITAL_BASED_OUTPATIENT_CLINIC_OR_DEPARTMENT_OTHER): Payer: Self-pay | Admitting: *Deleted

## 2017-05-29 DIAGNOSIS — M545 Low back pain, unspecified: Secondary | ICD-10-CM

## 2017-05-29 DIAGNOSIS — Y990 Civilian activity done for income or pay: Secondary | ICD-10-CM | POA: Insufficient documentation

## 2017-05-29 DIAGNOSIS — Z79899 Other long term (current) drug therapy: Secondary | ICD-10-CM | POA: Insufficient documentation

## 2017-05-29 DIAGNOSIS — M62830 Muscle spasm of back: Secondary | ICD-10-CM | POA: Insufficient documentation

## 2017-05-29 DIAGNOSIS — Z87891 Personal history of nicotine dependence: Secondary | ICD-10-CM | POA: Insufficient documentation

## 2017-05-29 DIAGNOSIS — Y939 Activity, unspecified: Secondary | ICD-10-CM | POA: Insufficient documentation

## 2017-05-29 DIAGNOSIS — Y929 Unspecified place or not applicable: Secondary | ICD-10-CM | POA: Insufficient documentation

## 2017-05-29 DIAGNOSIS — X509XXA Other and unspecified overexertion or strenuous movements or postures, initial encounter: Secondary | ICD-10-CM | POA: Insufficient documentation

## 2017-05-29 DIAGNOSIS — S39012A Strain of muscle, fascia and tendon of lower back, initial encounter: Secondary | ICD-10-CM | POA: Insufficient documentation

## 2017-05-29 MED ORDER — PREDNISONE 20 MG PO TABS: ORAL_TABLET | ORAL | 0 refills | Status: DC

## 2017-05-29 MED ORDER — NAPROXEN 500 MG PO TABS: 500.0000 mg | ORAL_TABLET | Freq: Two times a day (BID) | ORAL | 0 refills | Status: DC

## 2017-05-29 MED ORDER — KETOROLAC TROMETHAMINE 30 MG/ML IJ SOLN
30.0000 mg | Freq: Once | INTRAMUSCULAR | Status: AC
  Administered 2017-05-29: 30 mg via INTRAMUSCULAR
  Filled 2017-05-29: qty 1

## 2017-05-29 MED ORDER — CYCLOBENZAPRINE HCL 10 MG PO TABS: 10.0000 mg | ORAL_TABLET | Freq: Three times a day (TID) | ORAL | 0 refills | Status: DC | PRN

## 2017-05-29 NOTE — ED Triage Notes (Signed)
Back pain x 1 week. Worse with walking and moving.

## 2017-05-29 NOTE — ED Provider Notes (Signed)
[facility]   CSN: [id number] Arrival date & time: 05/29/17  1611     History   Chief Complaint Chief Complaint  Patient presents with  . Back Pain    HPI Samuel Schwartz is a 31 y.o. otherwise healthy male who presents to the ED with complaints of lower back pain 1 week after he lifted a heavy box at work. He does a lot of heavy lifting at work, and recalls that last week he had bent over and had some mild pain, and then a few minutes later he tried to lift a heavy box and the pain began. He describes the pain as 10/10 constant throbbing bilateral lower back pain radiating down both posterior thighs, worse with movement or bending, and unrelieved with ibuprofen, Goody's powders, and Aleve. He denies any falls or trauma, denies history of cancer or IV drug use. He denies fevers, chills, CP, SOB, abd pain, N/V/D/C, hematuria, dysuria, incontinence of urine/stool, saddle anesthesia/cauda equina symptoms, myalgias, arthralgias, numbness, tingling, focal weakness, or any other complaints at this time.    The history is provided by the patient and medical records. No language interpreter was used.  Back Pain   This is a new problem. The current episode started more than 2 days ago. The problem occurs constantly. The problem has not changed since onset.The pain is associated with lifting heavy objects. The pain is present in the lumbar spine. Quality: throbbing. The pain radiates to the left thigh and right thigh. The pain is at a severity of 10/10. The pain is moderate. The symptoms are aggravated by bending (and walking). The pain is the same all the time. Pertinent negatives include no chest pain, no fever, no numbness, no abdominal pain, no bowel incontinence, no perianal numbness, no bladder incontinence, no dysuria, no paresthesias, no paresis, no tingling and no weakness. He has tried NSAIDs for the symptoms. The treatment provided no relief.    History  reviewed. No pertinent past medical history.  Patient Active Problem List   Diagnosis Date Noted  . Right foot injury, initial encounter 12/04/2016  . Left wrist sprain 11/23/2015  . Right ankle sprain 11/23/2015    History reviewed. No pertinent surgical history.     Home Medications    Prior to Admission medications   Medication Sig Start Date End Date Taking? Authorizing Provider  docusate sodium (COLACE) 100 MG capsule Take 1 capsule (100 mg total) by mouth every 12 (twelve) hours. 04/05/17   Mackuen, Cindee Saltourteney Lyn, MD    Family History History reviewed. No pertinent family history.  Social History Social History  Substance Use Topics  . Smoking status: Former Smoker    Types: Cigarettes  . Smokeless tobacco: Never Used  . Alcohol use 0.0 oz/week     Comment: occ     Allergies   Patient has no known allergies.   Review of Systems Review of Systems  Constitutional: Negative for chills and fever.  Respiratory: Negative for shortness of breath.   Cardiovascular: Negative for chest pain.  Gastrointestinal: Negative for abdominal pain, bowel incontinence, constipation, diarrhea, nausea and vomiting.  Genitourinary: Negative for bladder incontinence, difficulty urinating (no incontinence), dysuria and hematuria.  Musculoskeletal: Positive for back pain. Negative for arthralgias and myalgias.  Skin: Negative for color change.  Allergic/Immunologic: Negative for immunocompromised state.  Neurological: Negative for tingling, weakness, numbness and paresthesias.  Psychiatric/Behavioral: Negative for confusion.   All other systems reviewed and are negative for acute change except as  noted in the HPI.    Physical Exam Updated Vital Signs BP (!) 128/95 (BP Location: Right Arm)   Pulse 81   Temp 98.3 F (36.8 C) (Oral)   Resp 18   SpO2 100%   Physical Exam  Constitutional: He is oriented to person, place, and time. Vital signs are normal. He appears well-developed  and well-nourished.  Non-toxic appearance. No distress.  Afebrile, nontoxic, NAD  HENT:  Head: Normocephalic and atraumatic.  Mouth/Throat: Mucous membranes are normal.  Eyes: Conjunctivae and EOM are normal. Right eye exhibits no discharge. Left eye exhibits no discharge.  Neck: Normal range of motion. Neck supple.  Cardiovascular: Normal rate and intact distal pulses.   Pulmonary/Chest: Effort normal. No respiratory distress.  Abdominal: Normal appearance. He exhibits no distension.  Musculoskeletal: Normal range of motion.       Lumbar back: He exhibits tenderness and spasm. He exhibits normal range of motion, no bony tenderness and no deformity.  Lumbar spine with FROM intact without spinous process TTP, no bony stepoffs or deformities, with mild b/l paraspinous muscle TTP and muscle spasms. Strength and sensation grossly intact in all extremities, negative SLR bilaterally, gait steady and nonantalgic. No overlying skin changes. Distal pulses intact.  Neurological: He is alert and oriented to person, place, and time. He has normal strength. No sensory deficit.  Skin: Skin is warm, dry and intact. No rash noted.  Psychiatric: He has a normal mood and affect.  Nursing note and vitals reviewed.    ED Treatments / Results  Labs (all labs ordered are listed, but only abnormal results are displayed) Labs Reviewed - No data to display  EKG  EKG Interpretation None       Radiology No results found.  Procedures Procedures (including critical care time)  Medications Ordered in ED Medications  ketorolac (TORADOL) 30 MG/ML injection 30 mg (not administered)     Initial Impression / Assessment and Plan / ED Course  I have reviewed the triage vital signs and the nursing notes.  Pertinent labs & imaging results that were available during my care of the patient were reviewed by me and considered in my medical decision making (see chart for details).     31 y.o. male here with  with c/o back pain after heavy lifting. On exam, mild b/l lumbar paraspinous muscle TTP and spasm; no midline spinal tenderness. No red flag s/s of low back pain. No s/s of central cord compression or cauda equina. Lower extremities are neurovascularly intact and patient is ambulating without difficulty. No urinary complaints. Doubt need for imaging/labs, likely muscular strain.  Patient was counseled on back pain precautions and told to do activity as tolerated but do not lift, push, or pull heavy objects more than 10 pounds for the next week. Patient counseled to use ice or heat on back for no longer than 15 minutes every hour.   Rx given for muscle relaxer and counseled on proper use of muscle relaxant medication. Urged patient not to drink alcohol, drive, or perform any other activities that requires focus while taking these medications. Rx for naprosyn given. Advised tylenol use as well. Will also send home with prednisone to help with radiating symptoms, although neg SLR bilaterally on exam.     Patient urged to follow-up with [facility] in 1-2wks for recheck of symptoms and to establish care, or sooner if pain does not improve with treatment and rest or if pain becomes recurrent. Urged to return with worsening severe pain, loss  of bowel or bladder control, trouble walking. The patient verbalizes understanding and agrees with the plan.    Final Clinical Impressions(s) / ED Diagnoses   Final diagnoses:  Acute bilateral low back pain without sciatica  Muscle spasm of back  Strain of lumbar region, initial encounter    New Prescriptions New Prescriptions   CYCLOBENZAPRINE (FLEXERIL) 10 MG TABLET    Take 1 tablet (10 mg total) by mouth 3 (three) times daily as needed for muscle spasms.   NAPROXEN (NAPROSYN) 500 MG TABLET    Take 1 tablet (500 mg total) by mouth 2 (two) times daily with a meal.   PREDNISONE (DELTASONE) 20 MG TABLET    3 tabs po daily x 4 days     [address] [place], [place] 05/29/17  1903    Tegeler, Canary Brim, MD 05/30/17 [phone]

## 2017-05-29 NOTE — Discharge Instructions (Signed)
Back Pain: Your back pain should be treated with medicines such as ibuprofen or aleve and this back pain should get better over the next 2 weeks.  However if you develop severe or worsening pain, low back pain with fever, numbness, weakness or inability to walk or urinate, you should return to the ER immediately.  Please follow up with your doctor this week for a recheck if still having symptoms.  Avoid heavy lifting over 10 pounds over the next two weeks.  Low back pain is discomfort in the lower back that may be due to injuries to muscles and ligaments around the spine.  Occasionally, it may be caused by a a problem to a part of the spine called a disc.  The pain may last several days or a week;  However, most patients get completely well in 4 weeks.  Self - care:  The application of heat can help soothe the pain.  Maintaining your daily activities, including walking, is encourged, as it will help you get better faster than just staying in bed. Perform gentle stretching as discussed. Drink plenty of fluids.  Medications are also useful to help with pain control.  A commonly prescribed medication includes tylenol. Take as needed for additional pain relief.   Non steroidal anti inflammatory medications including Ibuprofen and naproxen;  These medications help both pain and swelling and are very useful in treating back pain.  They should be taken with food, as they can cause stomach upset, and more seriously, stomach bleeding.    Muscle relaxants:  These medications can help with muscle tightness that is a cause of lower back pain.  Most of these medications can cause drowsiness, and it is not safe to drive or use dangerous machinery while taking them.  Prednisone: take this in the morning with breakfast, to help with symptoms.   SEEK IMMEDIATE MEDICAL ATTENTION IF: New numbness, tingling, weakness, or problem with the use of your arms or legs.  Severe back pain not relieved with medications.    Difficulty with or loss of control of your bowel or bladder control.  Increasing pain in any areas of the body (such as chest or abdominal pain).  Shortness of breath, dizziness or fainting.  Nausea (feeling sick to your stomach), vomiting, fever, or sweats.  You will need to follow up with [facility] in 1-2 weeks for reassessment and to establish medical care

## 2017-08-12 ENCOUNTER — Encounter (HOSPITAL_BASED_OUTPATIENT_CLINIC_OR_DEPARTMENT_OTHER): Payer: Self-pay | Admitting: *Deleted

## 2017-08-12 ENCOUNTER — Emergency Department (HOSPITAL_BASED_OUTPATIENT_CLINIC_OR_DEPARTMENT_OTHER)
Admission: EM | Admit: 2017-08-12 | Discharge: 2017-08-12 | Disposition: A | Discharge status: Home / Self Care | Payer: Self-pay | Attending: Emergency Medicine | Admitting: Emergency Medicine

## 2017-08-12 ENCOUNTER — Emergency Department (HOSPITAL_BASED_OUTPATIENT_CLINIC_OR_DEPARTMENT_OTHER): Payer: Self-pay

## 2017-08-12 DIAGNOSIS — R1031 Right lower quadrant pain: Secondary | ICD-10-CM | POA: Insufficient documentation

## 2017-08-12 DIAGNOSIS — M5441 Lumbago with sciatica, right side: Secondary | ICD-10-CM | POA: Insufficient documentation

## 2017-08-12 DIAGNOSIS — F17210 Nicotine dependence, cigarettes, uncomplicated: Secondary | ICD-10-CM | POA: Insufficient documentation

## 2017-08-12 DIAGNOSIS — N50811 Right testicular pain: Secondary | ICD-10-CM | POA: Insufficient documentation

## 2017-08-12 DIAGNOSIS — Z79899 Other long term (current) drug therapy: Secondary | ICD-10-CM | POA: Insufficient documentation

## 2017-08-12 LAB — URINALYSIS, ROUTINE W REFLEX MICROSCOPIC
Bilirubin Urine: NEGATIVE
GLUCOSE, UA: NEGATIVE mg/dL
Hgb urine dipstick: NEGATIVE
KETONES UR: NEGATIVE mg/dL
LEUKOCYTES UA: NEGATIVE
NITRITE: NEGATIVE
Protein, ur: NEGATIVE mg/dL
Specific Gravity, Urine: 1.02 (ref 1.005–1.030)
pH: 6 (ref 5.0–8.0)

## 2017-08-12 LAB — CBC WITH DIFFERENTIAL/PLATELET
BASOS ABS: 0 10*3/uL (ref 0.0–0.1)
BASOS PCT: 0 %
EOS PCT: 3 %
Eosinophils Absolute: 0.1 10*3/uL (ref 0.0–0.7)
HCT: 42.1 % (ref 39.0–52.0)
Hemoglobin: 13.7 g/dL (ref 13.0–17.0)
LYMPHS PCT: 44 %
Lymphs Abs: 2.3 10*3/uL (ref 0.7–4.0)
MCH: 27.2 pg (ref 26.0–34.0)
MCHC: 32.5 g/dL (ref 30.0–36.0)
MCV: 83.5 fL (ref 78.0–100.0)
Monocytes Absolute: 0.4 10*3/uL (ref 0.1–1.0)
Monocytes Relative: 7 %
NEUTROS ABS: 2.4 10*3/uL (ref 1.7–7.7)
Neutrophils Relative %: 46 %
PLATELETS: 216 10*3/uL (ref 150–400)
RBC: 5.04 MIL/uL (ref 4.22–5.81)
RDW: 13.9 % (ref 11.5–15.5)
WBC: 5.2 10*3/uL (ref 4.0–10.5)

## 2017-08-12 LAB — COMPREHENSIVE METABOLIC PANEL
ALK PHOS: 62 U/L (ref 38–126)
ALT: 40 U/L (ref 17–63)
ANION GAP: 6 (ref 5–15)
AST: 27 U/L (ref 15–41)
Albumin: 4.2 g/dL (ref 3.5–5.0)
BILIRUBIN TOTAL: 0.4 mg/dL (ref 0.3–1.2)
BUN: 12 mg/dL (ref 6–20)
CALCIUM: 9 mg/dL (ref 8.9–10.3)
CO2: 28 mmol/L (ref 22–32)
Chloride: 105 mmol/L (ref 101–111)
Creatinine, Ser: 0.97 mg/dL (ref 0.61–1.24)
GFR calc Af Amer: >60 mL/min (ref >60)
Glucose, Bld: 99 mg/dL (ref 65–99)
POTASSIUM: 4 mmol/L (ref 3.5–5.1)
Sodium: 139 mmol/L (ref 135–145)
TOTAL PROTEIN: 7.1 g/dL (ref 6.5–8.1)

## 2017-08-12 MED ORDER — NAPROXEN 500 MG PO TABS: 500.0000 mg | ORAL_TABLET | Freq: Two times a day (BID) | ORAL | 0 refills | Status: DC

## 2017-08-12 MED ORDER — METHYLPREDNISOLONE 4 MG PO TBPK: ORAL_TABLET | ORAL | 0 refills | Status: DC

## 2017-08-12 NOTE — ED Notes (Signed)
Pt directed to pharmacy to pick up Rx. Ambulatory with steady gait to d/c window 

## 2017-08-12 NOTE — ED Notes (Signed)
Patient transported to Ultrasound 

## 2017-08-12 NOTE — ED Provider Notes (Signed)
[facility] EMERGENCY DEPARTMENT Provider Note   CSN: [id number] Arrival date & time: 08/12/17  [id number]     History   Chief Complaint Chief Complaint  Patient presents with  . Back Pain  . Testicle Pain    HPI Samuel BaileyBrian A Schwartz is a 31 y.o. male.  The history is provided by the patient. No language interpreter was used.  Back Pain    Testicle Pain     Samuel BaileyBrian A Pinedo is a 31 y.o. male who presents to the Emergency Department complaining of testicle pain, back pain.  He reports one week of progressively worsening right testicular pain.  Initially his pain was intermittent in nature and alleviated with ibuprofen or naproxen.  Now pain is constant in nature and feels like he has been kicked in the testicle on the right.  No dysuria or penile discharge, no new sexual contacts.  He also reports several weeks of progressive right sided low back pain with pain that radiates down the right leg.  Pain is constant in nature with no change in positioning or movement.  No fever, night sweats, numbness, weakness, vomiting, weight changes.    History reviewed. No pertinent past medical history.  Patient Active Problem List   Diagnosis Date Noted  . Right foot injury, initial encounter 12/04/2016  . Left wrist sprain 11/23/2015  . Right ankle sprain 11/23/2015    Past Surgical History:  Procedure Laterality Date  . OTHER SURGICAL HISTORY     reports surgery on his penis as a child       Home Medications    Prior to Admission medications   Medication Sig Start Date End Date Taking? Authorizing Provider  cyclobenzaprine (FLEXERIL) 10 MG tablet Take 1 tablet (10 mg total) by mouth 3 (three) times daily as needed for muscle spasms. 05/29/17   Street, [place], PA-C  docusate sodium (COLACE) 100 MG capsule Take 1 capsule (100 mg total) by mouth every 12 (twelve) hours. 04/05/17   Mackuen, Courteney Lyn, MD  naproxen (NAPROSYN) 500 MG tablet Take 1 tablet (500 mg total) by  mouth 2 (two) times daily with a meal. 05/29/17   Street, [place], PA-C  predniSONE (DELTASONE) 20 MG tablet 3 tabs po daily x 4 days 05/29/17   Street, [place], PA-C    Family History No family history on file.  Social History Social History  Substance Use Topics  . Smoking status: Current Every Day Smoker    Types: Cigarettes  . Smokeless tobacco: Never Used  . Alcohol use 0.0 oz/week     Comment: occ     Allergies   Patient has no known allergies.   Review of Systems Review of Systems  Genitourinary: Positive for testicular pain.  Musculoskeletal: Positive for back pain.  All other systems reviewed and are negative.    Physical Exam Updated Vital Signs BP 131/79 (BP Location: Right Arm)   Pulse 70   Temp 98.2 F (36.8 C) (Oral)   Resp 18   Ht 5\' 6"  (1.676 m)   Wt 77.1 kg (170 lb)   SpO2 98%   BMI 27.44 kg/m   Physical Exam  Constitutional: He is oriented to person, place, and time. He appears well-developed and well-nourished.  HENT:  Head: Normocephalic and atraumatic.  Cardiovascular: Normal rate and regular rhythm.   No murmur heard. Pulmonary/Chest: Effort normal and breath sounds normal. No respiratory distress.  Abdominal: Soft. There is no rebound and no guarding.  Mild RLQ tenderness, mild right CVA  tenderness.  No midline back tenderness  Genitourinary:  Genitourinary Comments: Circumcised.  Mild right testicular/scrotal tenderness. No masses, no hernia, no lymphadenopathy.    Musculoskeletal: He exhibits no edema or tenderness.  2+ DP pulses bilaterally.    Neurological: He is alert and oriented to person, place, and time.  5/5 strength in BLE, sensation to light touch intact in BLE.  Negative straight leg raise.    Skin: Skin is warm and dry.  Psychiatric: He has a normal mood and affect. His behavior is normal.  Nursing note and vitals reviewed.    ED Treatments / Results  Labs (all labs ordered are listed, but only abnormal results are  displayed) Labs Reviewed - No data to display  EKG  EKG Interpretation None       Radiology No results found.  Procedures Procedures (including critical care time)  Medications Ordered in ED Medications - No data to display   Initial Impression / Assessment and Plan / ED Course  I have reviewed the triage vital signs and the nursing notes.  Pertinent labs & imaging results that were available during my care of the patient were reviewed by me and considered in my medical decision making (see chart for details).     Patient here for evaluation of right testicular pain, low back pain.  He has no masses or suspicious lesions on examination.  UA is not consistent with UTI, renal function is stable.  Testicular ultrasound is negative for masses, torsion.  CT renal stone is negative for obstructing stone.  Presentation is not consistent with cauda equina, epidural abscess.  He is neurovascularly intact on examination.  Discussed with patient home care for testicular pain as well as lumbosacral pain with radiculopathy.  Discussed outpatient follow-up and return precautions.  Final Clinical Impressions(s) / ED Diagnoses   Final diagnoses:  None    New Prescriptions New Prescriptions   No medications on file     Tilden Fossa, MD 08/13/17 [phone]

## 2017-08-12 NOTE — ED Notes (Signed)
Patient transported to CT 

## 2017-08-12 NOTE — ED Notes (Signed)
ED Provider at bedside. 

## 2017-08-12 NOTE — ED Triage Notes (Signed)
Pt reports right side low back pain and right testicle pain for over a week. Pain is intermittent. Denies known injury. States he had to do a lot of lifting at his job but denies specific injury. Denies swelling to testicle

## 2017-08-13 LAB — RPR: RPR Ser Ql: NONREACTIVE

## 2017-08-13 LAB — GC/CHLAMYDIA PROBE AMP (~~LOC~~) NOT AT ARMC
CHLAMYDIA, DNA PROBE: NEGATIVE
Neisseria Gonorrhea: NEGATIVE

## 2017-08-13 LAB — HIV ANTIBODY (ROUTINE TESTING W REFLEX): HIV SCREEN 4TH GENERATION: NONREACTIVE

## 2017-09-04 IMAGING — DX DG ANKLE COMPLETE 3+V*R*
3 series · 3 of 3 positions shown · non-contrast
Comparison: 11/14/2015

CLINICAL DATA: Work related right ankle injury.

EXAM:
RIGHT ANKLE - COMPLETE 3+ VIEW

[ankle ap]
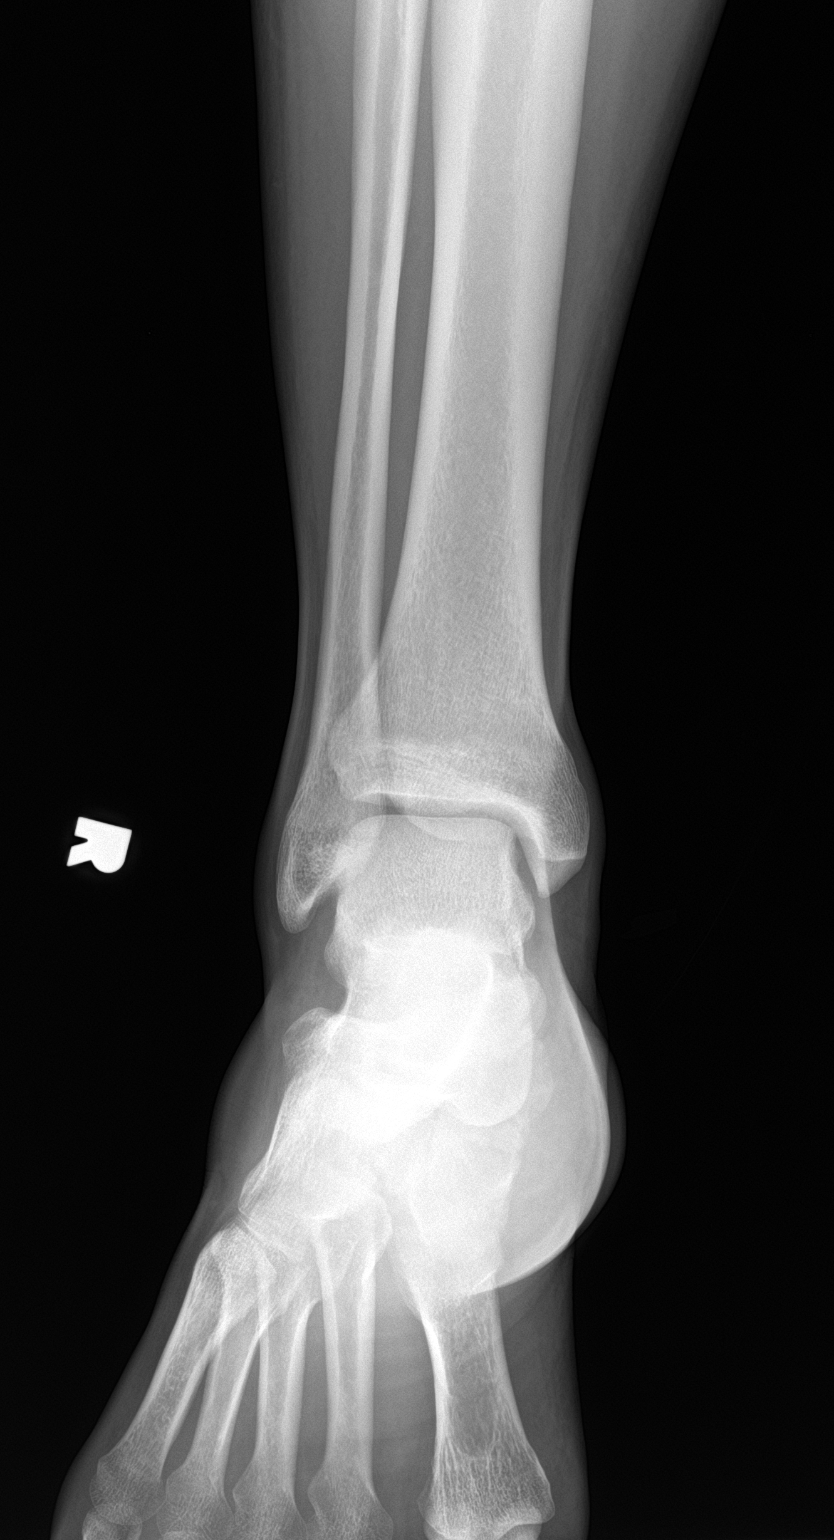

[ankle obl]
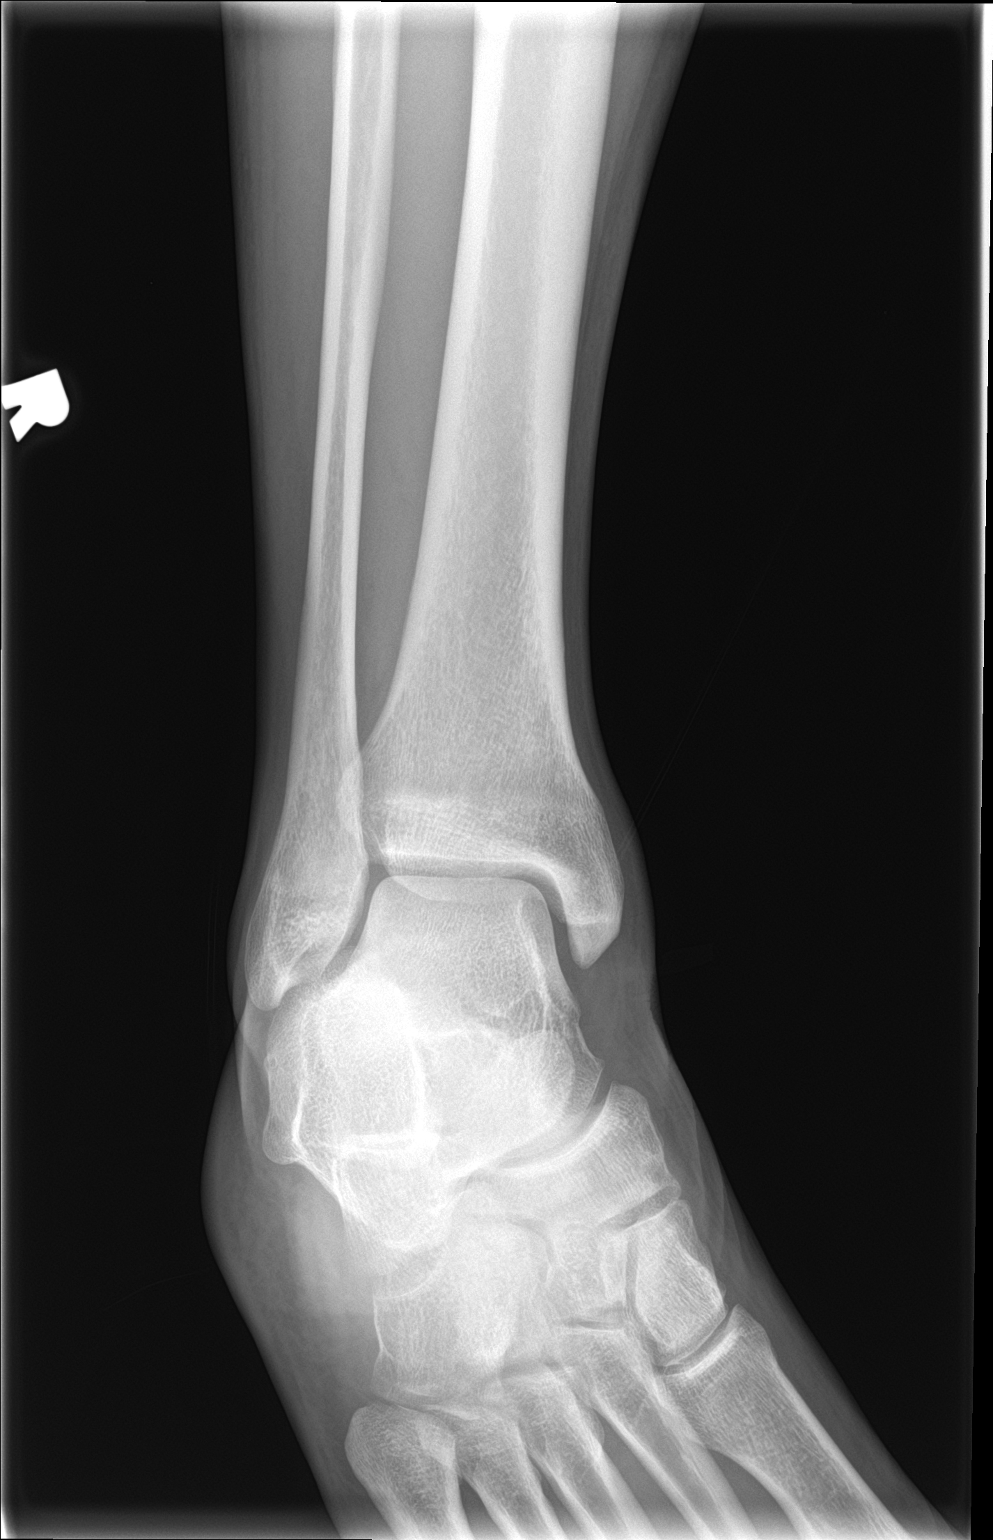

[ankle lat]
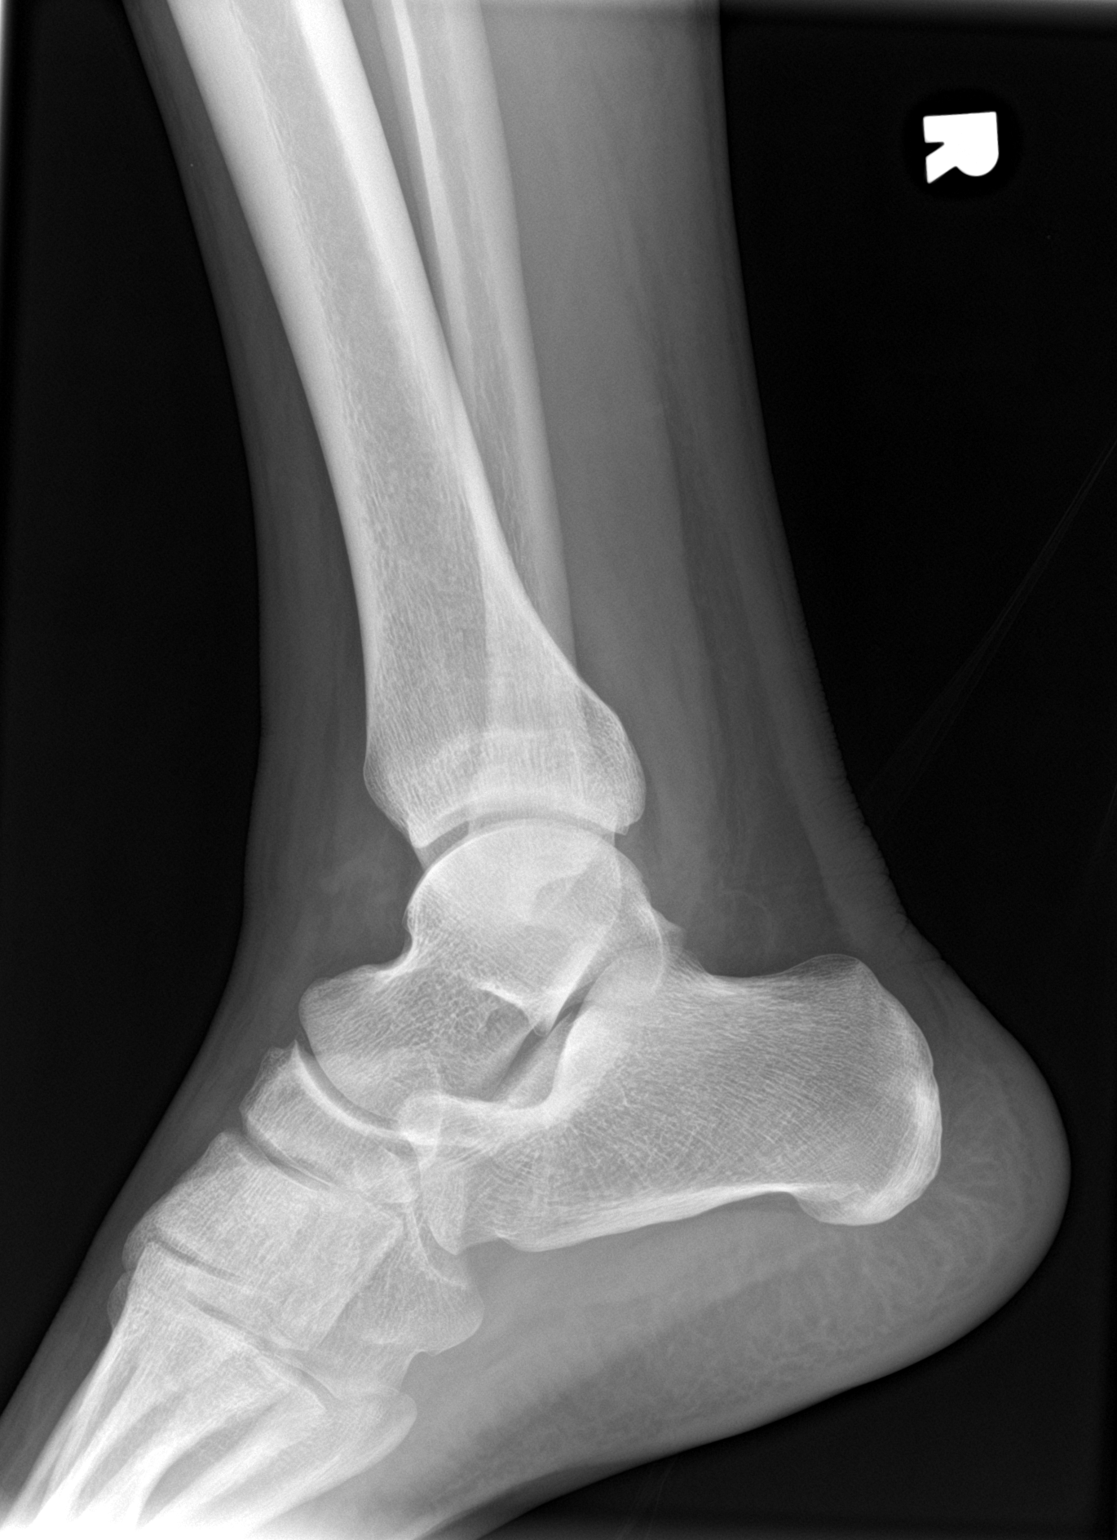

[3 of 3 positions shown; findings below may reference images not displayed]

FINDINGS: There is no evidence of fracture, dislocation, or joint effusion.
There is no evidence of arthropathy or other focal bone abnormality.
Soft tissues are unremarkable.
IMPRESSION: Negative.

## 2017-09-22 ENCOUNTER — Encounter (HOSPITAL_BASED_OUTPATIENT_CLINIC_OR_DEPARTMENT_OTHER): Payer: Self-pay | Admitting: Emergency Medicine

## 2017-09-22 ENCOUNTER — Emergency Department (HOSPITAL_BASED_OUTPATIENT_CLINIC_OR_DEPARTMENT_OTHER)
Admission: EM | Admit: 2017-09-22 | Discharge: 2017-09-22 | Disposition: A | Discharge status: Home / Self Care | Payer: Self-pay | Attending: Emergency Medicine | Admitting: Emergency Medicine

## 2017-09-22 ENCOUNTER — Emergency Department (HOSPITAL_BASED_OUTPATIENT_CLINIC_OR_DEPARTMENT_OTHER): Payer: Self-pay

## 2017-09-22 ENCOUNTER — Other Ambulatory Visit: Payer: Self-pay

## 2017-09-22 DIAGNOSIS — R519 Headache, unspecified: Secondary | ICD-10-CM

## 2017-09-22 DIAGNOSIS — R51 Headache: Secondary | ICD-10-CM | POA: Insufficient documentation

## 2017-09-22 DIAGNOSIS — Z79899 Other long term (current) drug therapy: Secondary | ICD-10-CM | POA: Insufficient documentation

## 2017-09-22 DIAGNOSIS — J3489 Other specified disorders of nose and nasal sinuses: Secondary | ICD-10-CM | POA: Insufficient documentation

## 2017-09-22 DIAGNOSIS — J208 Acute bronchitis due to other specified organisms: Secondary | ICD-10-CM | POA: Insufficient documentation

## 2017-09-22 DIAGNOSIS — R0981 Nasal congestion: Secondary | ICD-10-CM | POA: Insufficient documentation

## 2017-09-22 DIAGNOSIS — F17210 Nicotine dependence, cigarettes, uncomplicated: Secondary | ICD-10-CM | POA: Insufficient documentation

## 2017-09-22 LAB — RAPID STREP SCREEN (MED CTR MEBANE ONLY): Streptococcus, Group A Screen (Direct): NEGATIVE

## 2017-09-22 MED ORDER — ACETAMINOPHEN 325 MG PO TABS
650.0000 mg | ORAL_TABLET | Freq: Once | ORAL | Status: AC
  Administered 2017-09-22: 650 mg via ORAL
  Filled 2017-09-22: qty 2

## 2017-09-22 MED ORDER — DM-GUAIFENESIN ER 30-600 MG PO TB12: 1.0000 | ORAL_TABLET | Freq: Two times a day (BID) | ORAL | 0 refills | Status: DC

## 2017-09-22 MED ORDER — KETOROLAC TROMETHAMINE 30 MG/ML IJ SOLN
30.0000 mg | Freq: Once | INTRAMUSCULAR | Status: AC
  Administered 2017-09-22: 30 mg via INTRAMUSCULAR
  Filled 2017-09-22: qty 1

## 2017-09-22 NOTE — ED Triage Notes (Signed)
Patient reports that he is having coughing, chest pain, headache. He also reports that he has a sore throat and feel real tired. Patient reports that he has felt this way x 1 week

## 2017-09-22 NOTE — ED Provider Notes (Signed)
[facility] EMERGENCY DEPARTMENT Provider Note   CSN: [id number] Arrival date & time: 09/22/17  1830     History   Chief Complaint Chief Complaint  Patient presents with  . Cough    HPI Samuel Schwartz is a 31 y.o. male.  HPI   Patient is a 31 year old male with no significant past medical history presenting for a cough for 1 week.  Additionally, patient reports that his throat becomes irritated when he coughs.  Patient has also had a headache for 2-3 days.  Patient reports the cough is productive of sputum.  No hemoptysis.  Patient believes he had a fever on the first day of this illness 1 week ago, however it is resolved.  Patient reports some congestion and rhinorrhea.  Headache is frontal in character.  Headache is not associated with visual changes.  No focal neurologic changes.  Patient has tried nighttime, and over-the-counter medication for cough and cold with minimal relief.  Patient has not taken anything in 2 days.  History reviewed. No pertinent past medical history.  Patient Active Problem List   Diagnosis Date Noted  . Right foot injury, initial encounter 12/04/2016  . Left wrist sprain 11/23/2015  . Right ankle sprain 11/23/2015    Past Surgical History:  Procedure Laterality Date  . OTHER SURGICAL HISTORY     reports surgery on his penis as a child       Home Medications    Prior to Admission medications   Medication Sig Start Date End Date Taking? Authorizing Provider  cyclobenzaprine (FLEXERIL) 10 MG tablet Take 1 tablet (10 mg total) by mouth 3 (three) times daily as needed for muscle spasms. 05/29/17   Street, [place] Arg[place]cedes, PA-C  dextromethorphan-guaiFENesin (MUCINEX DM) 30-600 MG 12hr tablet Take 1 tablet by mouth 2 (two) times daily. 09/22/17   Aviva KluverMurray, Alver Leete B, PA-C  docusate sodium (COLACE) 100 MG capsule Take 1 capsule (100 mg total) by mouth every 12 (twelve) hours. 04/05/17   Mackuen, Courteney Lyn, MD  methylPREDNISolone (MEDROL  DOSEPAK) 4 MG TBPK tablet Take according to label instructions 08/12/17   Tilden Fossaees, Elizabeth, MD  naproxen (NAPROSYN) 500 MG tablet Take 1 tablet (500 mg total) by mouth 2 (two) times daily. 08/12/17   Tilden Fossaees, Elizabeth, MD  predniSONE (DELTASONE) 20 MG tablet 3 tabs po daily x 4 days 05/29/17   Street, [place]cedes, [place]-C    Family History History reviewed. No pertinent family history.  Social History Social History   Tobacco Use  . Smoking status: Current Every Day Smoker    Types: Cigarettes  . Smokeless tobacco: Never Used  Substance Use Topics  . Alcohol use: Yes    Alcohol/week: 0.0 oz    Comment: occ  . Drug use: No     Allergies   Patient has no known allergies.   Review of Systems Review of Systems  Constitutional: Negative for chills and fever.  HENT: Positive for congestion and sore throat. Negative for trouble swallowing.   Eyes: Negative for visual disturbance.  Respiratory: Positive for cough. Negative for wheezing.   Cardiovascular: Negative for chest pain.  Gastrointestinal: Negative for diarrhea, nausea and vomiting.  Neurological: Positive for headaches.     Physical Exam Updated Vital Signs BP 109/69 (BP Location: Right Arm)   Pulse 64   Temp 98.5 F (36.9 C) (Oral)   Resp 17   Ht 5\' 6"  (1.676 m)   Wt 74.8 kg (165 lb)   SpO2 99%   BMI 26.63 kg/m  Physical Exam  Constitutional: He appears well-developed and well-nourished. No distress.  Sitting comfortably in bed.  HENT:  Head: Normocephalic and atraumatic.  Normal phonation. No muffled voice sounds. Patient swallows secretions without difficulty. Dentition noramal. No lesions of tongue or buccal mucosa. Uvula midline. No asymmetric swelling of the posterior pharynx. No erythema of posterior pharynx. No tonsillar exuduate. No lingual swelling. No induration inferior to tongue. No submandibular tenderness, swelling, or induration.  Tissues of the neck supple. No cervical lymphadenopathy. Right TM  without erythema or effusion; left TM without erythema or effusion.  Eyes: Conjunctivae are normal. Right eye exhibits no discharge. Left eye exhibits no discharge.  EOMs normal to gross examination.  Neck: Normal range of motion.  Cardiovascular: Normal rate and regular rhythm.  Pulmonary/Chest: Effort normal and breath sounds normal.  Normal respiratory effort. Patient converses comfortably. No audible wheeze or stridor.  Abdominal: He exhibits no distension.  Musculoskeletal: Normal range of motion.  Neurological: He is alert.  Cranial nerves intact to gross observation. Patient moves extremities without difficulty.  Skin: Skin is warm and dry. He is not diaphoretic.  Psychiatric: He has a normal mood and affect. His behavior is normal. Judgment and thought content normal.  Nursing note and vitals reviewed.    ED Treatments / Results  Labs (all labs ordered are listed, but only abnormal results are displayed) Labs Reviewed  RAPID STREP SCREEN (NOT AT [facility])  CULTURE, GROUP A STREP [facility])    EKG  EKG Interpretation None       Radiology Dg Chest 2 View  Result Date: 09/22/2017 CLINICAL DATA:  Cough and fever for 5 days. EXAM: CHEST  2 VIEW COMPARISON:  07/15/2016 FINDINGS: The heart size and mediastinal contours are within normal limits. Both lungs are clear. The visualized skeletal structures are unremarkable. IMPRESSION: No active cardiopulmonary disease. Electronically Signed   By: Myles RosenthalJohn  Stahl M.D.   On: 09/22/2017 19:06    Procedures Procedures (including critical care time)  Medications Ordered in ED Medications  ketorolac (TORADOL) 30 MG/ML injection 30 mg (not administered)  acetaminophen (TYLENOL) tablet 650 mg (not administered)     Initial Impression / Assessment and Plan / ED Course  I have reviewed the triage vital signs and the nursing notes.  Pertinent labs & imaging results that were available during my care of the patient were reviewed by me and  considered in my medical decision making (see chart for details).      Final Clinical Impressions(s) / ED Diagnoses   Final diagnoses:  Viral bronchitis  Frontal headache   Patient with symptoms consistent with a viral syndrome. Vitals are stable, no fever. No signs of dehydration. Lung exam normal, no signs of pneumonia.  Initially, sore throat is likely due to irritation from the cough.  There is no evidence of pharyngeal swelling, fluids angina, or retropharyngeal abscess.  Strep negative today.  Supportive therapy indicated with return if symptoms worsen.  Return precautions given for any involvement of fevers, shortness of breath, chest pain.  Patient is in understanding and agrees with the plan of care.  ED Discharge Orders        Ordered    dextromethorphan[facility]aiFENesin [facility](MUCINEX DM) 30-600 MG 12hr tablet  2 times daily     09/22/17 2200       Delia ChimesMurray, Alton Tremblay B, PA-C 09/22/17 2203    Benjiman CorePickering, Nathan, MD 09/22/17 2328

## 2017-09-22 NOTE — Discharge Instructions (Signed)
Please read and follow all provided instructions.  Your diagnoses today include:  1. Viral bronchitis   2. Frontal headache     You appear to have an upper respiratory infection (URI). An upper respiratory tract infection, or cold, is a viral infection of the air passages leading to the lungs.  You may have a lingering cough that lasts for 2- 4 weeks after the infection.  Tests performed today include: Vital signs. See below for your results today.  Chest x-ray is normal today no evidence of pneumonia. You do not have strep throat.  Medications prescribed:   Take any prescribed medications only as directed. Treatment for your infection is aimed at treating the symptoms. There are no medications, such as antibiotics, that will cure your infection.   Home care instructions:  Follow any educational materials contained in this packet.   Your illness is contagious and can be spread to others, especially during the first 3 or 4 days. It cannot be cured by antibiotics or other medicines. Take basic precautions such as washing your hands often, covering your mouth when you cough or sneeze, and avoiding public places where you could spread your illness to others.   Please continue drinking plenty of fluids.  Use over-the-counter medicines as needed as directed on packaging for symptom relief.  You may also use ibuprofen or tylenol as directed on packaging for pain or fever.  Do not take multiple medicines containing Tylenol or acetaminophen to avoid taking too much of this medication.  Follow-up instructions: Please follow-up with your primary care provider in the next 3 days for further evaluation of your symptoms if you are not feeling better.   Return instructions:  Please return to the Emergency Department if you experience worsening symptoms.  RETURN IMMEDIATELY IF you develop shortness of breath, confusion or altered mental status, a new rash, become dizzy, faint, or poorly responsive, or  are unable to be cared for at home. Please return if you have persistent vomiting and cannot keep down fluids or develop a fever that is not controlled by tylenol or motrin.   Please return if you have any other emergent concerns.  Additional Information:  Your vital signs today were: BP 109/69 (BP Location: Right Arm)    Pulse 64    Temp 98.5 F (36.9 C) (Oral)    Resp 17    Ht 5\' 6"  (1.676 m)    Wt 74.8 kg (165 lb)    SpO2 99%    BMI 26.63 kg/m  If your blood pressure (BP) was elevated above 135/85 this visit, please have this repeated by your doctor within one month. --------------

## 2017-09-22 NOTE — ED Notes (Signed)
ED Provider at bedside. 

## 2017-09-22 NOTE — ED Notes (Signed)
Pt given d/c instructions as per chart. Rx x 1. Verbalizes understanding. No questions. 

## 2017-09-25 LAB — CULTURE, GROUP A STREP (THRC)

## 2018-01-19 ENCOUNTER — Other Ambulatory Visit: Payer: Self-pay

## 2018-01-19 ENCOUNTER — Emergency Department (HOSPITAL_BASED_OUTPATIENT_CLINIC_OR_DEPARTMENT_OTHER)
Admission: EM | Admit: 2018-01-19 | Discharge: 2018-01-19 | Disposition: A | Discharge status: Home / Self Care | Payer: Self-pay | Attending: Emergency Medicine | Admitting: Emergency Medicine

## 2018-01-19 ENCOUNTER — Encounter (HOSPITAL_BASED_OUTPATIENT_CLINIC_OR_DEPARTMENT_OTHER): Payer: Self-pay | Admitting: *Deleted

## 2018-01-19 DIAGNOSIS — Z79899 Other long term (current) drug therapy: Secondary | ICD-10-CM | POA: Insufficient documentation

## 2018-01-19 DIAGNOSIS — F17210 Nicotine dependence, cigarettes, uncomplicated: Secondary | ICD-10-CM | POA: Insufficient documentation

## 2018-01-19 DIAGNOSIS — J069 Acute upper respiratory infection, unspecified: Secondary | ICD-10-CM | POA: Insufficient documentation

## 2018-01-19 DIAGNOSIS — J0140 Acute pansinusitis, unspecified: Secondary | ICD-10-CM | POA: Insufficient documentation

## 2018-01-19 MED ORDER — PSEUDOEPHEDRINE HCL ER 120 MG PO TB12: 120.0000 mg | ORAL_TABLET | Freq: Two times a day (BID) | ORAL | 0 refills | Status: DC

## 2018-01-19 MED ORDER — LEVOCETIRIZINE DIHYDROCHLORIDE 5 MG PO TABS: 5.0000 mg | ORAL_TABLET | Freq: Every evening | ORAL | 1 refills | Status: DC

## 2018-01-19 MED ORDER — FLUTICASONE PROPIONATE 50 MCG/ACT NA SUSP: 1.0000 | Freq: Every day | NASAL | 2 refills | Status: DC

## 2018-01-19 MED ORDER — AZITHROMYCIN 250 MG PO TABS: ORAL_TABLET | ORAL | 0 refills | Status: DC

## 2018-01-19 NOTE — ED Notes (Signed)
EDPA into room, prior to RN assessment, see PA notes, pending orders.   

## 2018-01-19 NOTE — Discharge Instructions (Addendum)
Get help right away if: °You have a severe headache. °You have persistent vomiting. °You have pain or swelling around your face or eyes. °You have vision problems. °You develop confusion. °Your neck is stiff. °You have trouble breathing. °

## 2018-01-19 NOTE — ED Provider Notes (Signed)
[facility] EMERGENCY DEPARTMENT Provider Note   CSN: [id number] Arrival date & time: 01/19/18  1927     History   Chief Complaint Chief Complaint  Patient presents with  . Sinusitis    HPI Samuel Schwartz is a 32 y.o. male who presents emergency department chief complaint of sinusitis and URI symptoms.  He had onset of symptoms 5 days ago.  Complains of pain sinusitis pain, throbbing headache, nasal congestion, painful productive cough and generalized malaise without fever or chills.  Patient does have a history of seasonal allergies.  He is not taking any medication for his symptoms  HPI  History reviewed. No pertinent past medical history.  Patient Active Problem List   Diagnosis Date Noted  . Right foot injury, initial encounter 12/04/2016  . Left wrist sprain 11/23/2015  . Right ankle sprain 11/23/2015    Past Surgical History:  Procedure Laterality Date  . OTHER SURGICAL HISTORY     reports surgery on his penis as a child        Home Medications    Prior to Admission medications   Medication Sig Start Date End Date Taking? Authorizing Provider  cyclobenzaprine (FLEXERIL) 10 MG tablet Take 1 tablet (10 mg total) by mouth 3 (three) times daily as needed for muscle spasms. 05/29/17   Street, [place], PA-C  dextromethorphan-guaiFENesin (MUCINEX DM) 30-600 MG 12hr tablet Take 1 tablet by mouth 2 (two) times daily. 09/22/17   Aviva KluverMurray, Alyssa B, PA-C  docusate sodium (COLACE) 100 MG capsule Take 1 capsule (100 mg total) by mouth every 12 (twelve) hours. 04/05/17   Mackuen, Courteney Lyn, MD  methylPREDNISolone (MEDROL DOSEPAK) 4 MG TBPK tablet Take according to label instructions 08/12/17   Tilden Fossaees, Elizabeth, MD  naproxen (NAPROSYN) 500 MG tablet Take 1 tablet (500 mg total) by mouth 2 (two) times daily. 08/12/17   Tilden Fossaees, Elizabeth, MD  predniSONE (DELTASONE) 20 MG tablet 3 tabs po daily x 4 days 05/29/17   Street, [place]cedes, [place]-C    Family History History  reviewed. No pertinent family history.  Social History Social History   Tobacco Use  . Smoking status: Current Every Day Smoker    Types: Cigarettes  . Smokeless tobacco: Never Used  Substance Use Topics  . Alcohol use: Yes    Alcohol/week: 0.0 oz    Comment: occ  . Drug use: No     Allergies   Patient has no known allergies.   Review of Systems Review of Systems Ten systems reviewed and are negative for acute change, except as noted in the HPI.    Physical Exam Updated Vital Signs BP 129/81 (BP Location: Left Arm)   Pulse 69   Temp 98.8 F (37.1 C) (Oral)   Resp 18   Ht 5\' 6"  (1.676 m)   Wt 81.6 kg (180 lb)   SpO2 100%   BMI 29.05 kg/m   Physical Exam  Constitutional: He appears well-developed and well-nourished. No distress.  HENT:  Head: Normocephalic and atraumatic.    Sinus tenderness  Eyes: Pupils are equal, round, and reactive to light. Conjunctivae and EOM are normal. No scleral icterus.  Neck: Normal range of motion. Neck supple.  Cardiovascular: Normal rate, regular rhythm and normal heart sounds.  Pulmonary/Chest: Effort normal and breath sounds normal. No respiratory distress.  Abdominal: Soft. There is no tenderness.  Musculoskeletal: He exhibits no edema.  Neurological: He is alert.  Skin: Skin is warm and dry. He is not diaphoretic.  Psychiatric: His behavior  is normal.  Nursing note and vitals reviewed.     ED Treatments / Results  Labs (all labs ordered are listed, but only abnormal results are displayed) Labs Reviewed - No data to display  EKG None  Radiology No results found.  Procedures Procedures (including critical care time)  Medications Ordered in ED Medications - No data to display   Initial Impression / Assessment and Plan / ED Course  I have reviewed the triage vital signs and the nursing notes.  Pertinent labs & imaging results that were available during my care of the patient were reviewed by me and  considered in my medical decision making (see chart for details).     She with URI/sinus symptoms.  Patient will be given a prescription antibiotic which he can hold until he has had 7 days of symptoms.  Also treat with allergy medications.  No concern for meningitis.  Return precautions discussed.  Final Clinical Impressions(s) / ED Diagnoses   Final diagnoses:  Upper respiratory tract infection, unspecified type  Acute non-recurrent pansinusitis    ED Discharge Orders    None       Arthor Captain, PA-C 01/19/18 2304    Little, Ambrose Finland, MD 01/20/18 1755

## 2018-01-19 NOTE — ED Triage Notes (Signed)
Sinus infection x several days. HA, facial pain, sneezing, nasal congestion.

## 2018-01-27 ENCOUNTER — Other Ambulatory Visit: Payer: Self-pay

## 2018-01-27 ENCOUNTER — Emergency Department (HOSPITAL_BASED_OUTPATIENT_CLINIC_OR_DEPARTMENT_OTHER)
Admission: EM | Admit: 2018-01-27 | Discharge: 2018-01-27 | Disposition: A | Discharge status: Home / Self Care | Payer: Self-pay | Attending: Emergency Medicine | Admitting: Emergency Medicine

## 2018-01-27 ENCOUNTER — Encounter (HOSPITAL_BASED_OUTPATIENT_CLINIC_OR_DEPARTMENT_OTHER): Payer: Self-pay | Admitting: Emergency Medicine

## 2018-01-27 DIAGNOSIS — W25XXXA Contact with sharp glass, initial encounter: Secondary | ICD-10-CM | POA: Insufficient documentation

## 2018-01-27 DIAGNOSIS — Y92410 Unspecified street and highway as the place of occurrence of the external cause: Secondary | ICD-10-CM | POA: Insufficient documentation

## 2018-01-27 DIAGNOSIS — Z5321 Procedure and treatment not carried out due to patient leaving prior to being seen by health care provider: Secondary | ICD-10-CM | POA: Insufficient documentation

## 2018-01-27 DIAGNOSIS — Y9389 Activity, other specified: Secondary | ICD-10-CM | POA: Insufficient documentation

## 2018-01-27 DIAGNOSIS — S61512A Laceration without foreign body of left wrist, initial encounter: Secondary | ICD-10-CM | POA: Insufficient documentation

## 2018-01-27 DIAGNOSIS — Y998 Other external cause status: Secondary | ICD-10-CM | POA: Insufficient documentation

## 2018-01-27 NOTE — ED Notes (Signed)
Went to introduce myself to patient and cleanse the wound, pt not in treatment area.
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
## 2018-01-27 NOTE — ED Notes (Signed)
Has not returned to ED.

## 2018-01-27 NOTE — ED Notes (Signed)
Per [employer] and secretary the patient "walked out"

## 2018-01-30 NOTE — ED Notes (Signed)
01/30/2018, Follow-up call completed 

## 2019-01-25 ENCOUNTER — Encounter (HOSPITAL_BASED_OUTPATIENT_CLINIC_OR_DEPARTMENT_OTHER): Payer: Self-pay | Admitting: Emergency Medicine

## 2019-01-25 ENCOUNTER — Emergency Department (HOSPITAL_BASED_OUTPATIENT_CLINIC_OR_DEPARTMENT_OTHER)
Admission: EM | Admit: 2019-01-25 | Discharge: 2019-01-25 | Disposition: A | Discharge status: Home / Self Care | Payer: BLUE CROSS/BLUE SHIELD | Attending: Emergency Medicine | Admitting: Emergency Medicine

## 2019-01-25 ENCOUNTER — Other Ambulatory Visit: Payer: Self-pay

## 2019-01-25 DIAGNOSIS — Z79899 Other long term (current) drug therapy: Secondary | ICD-10-CM | POA: Insufficient documentation

## 2019-01-25 DIAGNOSIS — B9789 Other viral agents as the cause of diseases classified elsewhere: Secondary | ICD-10-CM

## 2019-01-25 DIAGNOSIS — R05 Cough: Secondary | ICD-10-CM | POA: Diagnosis present

## 2019-01-25 DIAGNOSIS — Z209 Contact with and (suspected) exposure to unspecified communicable disease: Secondary | ICD-10-CM | POA: Diagnosis not present

## 2019-01-25 DIAGNOSIS — J069 Acute upper respiratory infection, unspecified: Secondary | ICD-10-CM | POA: Insufficient documentation

## 2019-01-25 DIAGNOSIS — F17210 Nicotine dependence, cigarettes, uncomplicated: Secondary | ICD-10-CM | POA: Diagnosis not present

## 2019-01-25 MED ORDER — FLUTICASONE PROPIONATE 50 MCG/ACT NA SUSP: 2.0000 | Freq: Every day | NASAL | 0 refills | Status: DC

## 2019-01-25 MED ORDER — BENZONATATE 100 MG PO CAPS: 100.0000 mg | ORAL_CAPSULE | Freq: Three times a day (TID) | ORAL | 0 refills | Status: DC

## 2019-01-25 NOTE — ED Notes (Signed)
Pt reports he washed his car yesterday and when he got home he began coughing a lot. Hx of allergies. Pt reports trying OTC meds with no relief. Pt denies chest pain at this time. Pt denies fevers. Pt denies exposure. Reports he is unable to return to work until he has been cleared

## 2019-01-25 NOTE — ED Provider Notes (Signed)
[facility] EMERGENCY DEPARTMENT Provider Note   CSN: [id number] Arrival date & time: 01/25/19  1537    History   Chief Complaint Chief Complaint  Patient presents with  . Cough    HPI Samuel Schwartz is a 33 y.o. male.     33 yo M with a chief complaint of a cough.  Started yesterday and persisted into today.  Denies fevers denies chest pain denies shortness of breath.  His girlfriend has had a similar illness.  Denies recent travel.  Denies abdominal pain vomiting or diarrhea  The history is provided by the patient.  Cough  Associated symptoms: no chest pain, no chills, no eye discharge, no fever, no headaches, no myalgias, no rash and no shortness of breath   Illness  Severity:  Moderate Onset quality:  Sudden Duration:  2 days Timing:  Constant Progression:  Worsening Chronicity:  New Associated symptoms: cough   Associated symptoms: no abdominal pain, no chest pain, no congestion, no diarrhea, no fever, no headaches, no myalgias, no rash, no shortness of breath and no vomiting     History reviewed. No pertinent past medical history.  Patient Active Problem List   Diagnosis Date Noted  . Right foot injury, initial encounter 12/04/2016  . Left wrist sprain 11/23/2015  . Right ankle sprain 11/23/2015    Past Surgical History:  Procedure Laterality Date  . OTHER SURGICAL HISTORY     reports surgery on his penis as a child        Home Medications    Prior to Admission medications   Medication Sig Start Date End Date Taking? Authorizing Provider  azithromycin (ZITHROMAX Z-PAK) 250 MG tablet 2 po day one, then 1 daily x 4 days 01/19/18   Arthor Captain, PA-C  benzonatate (TESSALON) 100 MG capsule Take 1 capsule (100 mg total) by mouth every 8 (eight) hours. 01/25/19   Melene Plan, DO  cyclobenzaprine (FLEXERIL) 10 MG tablet Take 1 tablet (10 mg total) by mouth 3 (three) times daily as needed for muscle spasms. 05/29/17   Street, [place], PA-C   dextromethorphan-guaiFENesin (MUCINEX DM) 30-600 MG 12hr tablet Take 1 tablet by mouth 2 (two) times daily. 09/22/17   Aviva Kluver B, PA-C  docusate sodium (COLACE) 100 MG capsule Take 1 capsule (100 mg total) by mouth every 12 (twelve) hours. 04/05/17   Mackuen, Courteney Lyn, MD  fluticasone (FLONASE) 50 MCG/ACT nasal spray Place 2 sprays into both nostrils daily. 01/25/19   Melene Plan, DO  levocetirizine (XYZAL) 5 MG tablet Take 1 tablet (5 mg total) by mouth every evening. 01/19/18   Arthor Captain, PA-C  methylPREDNISolone (MEDROL DOSEPAK) 4 MG TBPK tablet Take according to label instructions 08/12/17   Tilden Fossa, MD  naproxen (NAPROSYN) 500 MG tablet Take 1 tablet (500 mg total) by mouth 2 (two) times daily. 08/12/17   Tilden Fossa, MD  predniSONE (DELTASONE) 20 MG tablet 3 tabs po daily x 4 days 05/29/17   Street, [place], [place]  pseudoephedrine (SUDAFED 12 HOUR) 120 MG 12 hr tablet Take 1 tablet (120 mg total) by mouth 2 (two) times daily. 01/19/18   Arthor Captain, PA-C    Family History No family history on file.  Social History Social History   Tobacco Use  . Smoking status: Current Every Day Smoker    Types: Cigarettes  . Smokeless tobacco: Never Used  Substance Use Topics  . Alcohol use: Yes    Alcohol/week: 0.0 standard drinks    Comment: occ  .  Drug use: No     Allergies   Patient has no known allergies.   Review of Systems Review of Systems  Constitutional: Negative for chills and fever.  HENT: Negative for congestion and facial swelling.   Eyes: Negative for discharge and visual disturbance.  Respiratory: Positive for cough. Negative for shortness of breath.   Cardiovascular: Negative for chest pain and palpitations.  Gastrointestinal: Negative for abdominal pain, diarrhea and vomiting.  Musculoskeletal: Negative for arthralgias and myalgias.  Skin: Negative for color change and rash.  Neurological: Negative for tremors, syncope and headaches.   Psychiatric/Behavioral: Negative for confusion and dysphoric mood.     Physical Exam Updated Vital Signs BP 126/89   Pulse 63   Temp 98.3 F (36.8 C) (Oral)   Resp 15   SpO2 98%   Physical Exam Vitals signs and nursing note reviewed.  Constitutional:      Appearance: He is well-developed.  HENT:     Head: Normocephalic and atraumatic.     Comments: Swollen turbinates, posterior nasal drip, no noted sinus ttp, tm normal bilaterally.   Eyes:     Pupils: Pupils are equal, round, and reactive to light.  Neck:     Musculoskeletal: Normal range of motion and neck supple.     Vascular: No JVD.  Cardiovascular:     Rate and Rhythm: Normal rate and regular rhythm.     Heart sounds: No murmur. No friction rub. No gallop.   Pulmonary:     Effort: No respiratory distress.     Breath sounds: No wheezing.  Abdominal:     General: There is no distension.     Tenderness: There is no guarding or rebound.  Musculoskeletal: Normal range of motion.  Skin:    Coloration: Skin is not pale.     Findings: No rash.  Neurological:     Mental Status: He is alert and oriented to person, place, and time.  Psychiatric:        Behavior: Behavior normal.      ED Treatments / Results  Labs (all labs ordered are listed, but only abnormal results are displayed) Labs Reviewed - No data to display  EKG None  Radiology No results found.  Procedures Procedures (including critical care time)  Medications Ordered in ED Medications - No data to display   Initial Impression / Assessment and Plan / ED Course  I have reviewed the triage vital signs and the nursing notes.  Pertinent labs & imaging results that were available during my care of the patient were reviewed by me and considered in my medical decision making (see chart for details).        33 yo M with a chief complaint of URI-like symptoms.  Going on since yesterday.  Patient is well-appearing and nontoxic he is clear lung  sounds.  He does have swollen turbinates and posterior nasal drip.  Suspect he has URI.  Seems unlikely that he has the novel coronavirus.  We will have him self isolate anyway.  Right amount of work for a week.  [name] was evaluated in Emergency Department on 01/25/2019 for the symptoms described in the history of present illness. He/she was evaluated in the context of the global COVID-19 pandemic, which necessitated consideration that the patient might be at risk for infection with the SARS-CoV-2 virus that causes COVID-19. Institutional protocols and algorithms that pertain to the evaluation of patients at risk for COVID-19 are in a state of rapid change based  on information released by regulatory bodies including the CDC and federal and state organizations. These policies and algorithms were followed during the patient's care in the ED.   4:02 PM:  I have discussed the diagnosis/risks/treatment options with the patient and believe the pt to be eligible for discharge home to follow-up with PCP. We also discussed returning to the ED immediately if new or worsening sx occur. We discussed the sx which are most concerning (e.g., sob, fever, inability to tolerate by mouth) that necessitate immediate return. Medications administered to the patient during their visit and any new prescriptions provided to the patient are listed below.  Medications given during this visit Medications - No data to display   The patient appears reasonably screen and/or stabilized for discharge and I doubt any other medical condition or other [facility] requiring further screening, evaluation, or treatment in the ED at this time prior to discharge.   Final Clinical Impressions(s) / ED Diagnoses   Final diagnoses:  Viral URI with cough    ED Discharge Orders         Ordered    benzonatate (TESSALON) 100 MG capsule  Every 8 hours     01/25/19 1555    fluticasone (FLONASE) 50 MCG/ACT nasal spray  Daily     01/25/19  1556           Harleigh, DO 01/25/19 1602

## 2019-01-25 NOTE — ED Triage Notes (Signed)
Cough since yesterday. Denies pain, fever.

## 2019-01-25 NOTE — Discharge Instructions (Signed)
Self isolate at home.  I think this is unlikely to be the new virus.  Please do not expose anyone to you just in case it is.  You should stay out of work for 7 days or 3 days after you start to feel better.  Return for shortness of breath and fever.   Take tylenol 2 pills 4 times a day and motrin 4 pills 3 times a day.  Drink plenty of fluids.  Return for worsening shortness of breath, headache, confusion. Follow up with your family doctor.

## 2020-07-09 ENCOUNTER — Encounter (HOSPITAL_BASED_OUTPATIENT_CLINIC_OR_DEPARTMENT_OTHER): Payer: Self-pay | Admitting: Emergency Medicine

## 2020-07-09 ENCOUNTER — Emergency Department (HOSPITAL_BASED_OUTPATIENT_CLINIC_OR_DEPARTMENT_OTHER)
Admission: EM | Admit: 2020-07-09 | Discharge: 2020-07-09 | Disposition: A | Discharge status: Home / Self Care | Payer: BC Managed Care – PPO | Attending: Emergency Medicine | Admitting: Emergency Medicine

## 2020-07-09 ENCOUNTER — Other Ambulatory Visit: Payer: Self-pay

## 2020-07-09 DIAGNOSIS — Z20822 Contact with and (suspected) exposure to covid-19: Secondary | ICD-10-CM | POA: Insufficient documentation

## 2020-07-09 DIAGNOSIS — F17210 Nicotine dependence, cigarettes, uncomplicated: Secondary | ICD-10-CM | POA: Diagnosis not present

## 2020-07-09 DIAGNOSIS — J029 Acute pharyngitis, unspecified: Secondary | ICD-10-CM | POA: Insufficient documentation

## 2020-07-09 LAB — SARS CORONAVIRUS 2 BY RT PCR (HOSPITAL ORDER, PERFORMED IN ~~LOC~~ HOSPITAL LAB): SARS Coronavirus 2: NEGATIVE

## 2020-07-09 LAB — GROUP A STREP BY PCR: Group A Strep by PCR: NOT DETECTED

## 2020-07-09 NOTE — ED Notes (Signed)
ED Provider Josh, PA at bedside.

## 2020-07-09 NOTE — ED Provider Notes (Signed)
[facility] EMERGENCY DEPARTMENT Provider Note   CSN: [id number] Arrival date & time: 07/09/20  1510     History Chief Complaint  Patient presents with  . Sore Throat  . Headache    Samuel Schwartz is a 34 y.o. male.  Patient presents the emergency department for evaluation of sore throat.  Patient has had sore throat and intermittent headaches over the past 3 days.  He had an exposure to strep throat in the past week.  Reports that sore throat was worse this morning.  He is able to swallow and drink fluids.  No sinus pressure symptoms, ear pain, cough.  No known Covid contacts.  He has not been vaccinated against Covid.  He does not have shortness of breath or trouble breathing.  Took allergy medicine yesterday.         History reviewed. No pertinent past medical history.  Patient Active Problem List   Diagnosis Date Noted  . Right foot injury, initial encounter 12/04/2016  . Left wrist sprain 11/23/2015  . Right ankle sprain 11/23/2015    Past Surgical History:  Procedure Laterality Date  . OTHER SURGICAL HISTORY     reports surgery on his penis as a child       No family history on file.  Social History   Tobacco Use  . Smoking status: Current Every Day Smoker    Types: Cigarettes, E-cigarettes  . Smokeless tobacco: Never Used  Vaping Use  . Vaping Use: Never used  Substance Use Topics  . Alcohol use: Yes    Alcohol/week: 0.0 standard drinks    Comment: occ  . Drug use: No    Home Medications Prior to Admission medications   Medication Sig Start Date End Date Taking? Authorizing Provider  fluticasone (FLONASE) 50 MCG/ACT nasal spray Place 2 sprays into both nostrils daily. 01/25/19 07/09/20  Melene Plan, DO  levocetirizine (XYZAL) 5 MG tablet Take 1 tablet (5 mg total) by mouth every evening. 01/19/18 07/09/20  Arthor Captain, PA-C    Allergies    Patient has no known allergies.  Review of Systems   Review of Systems  Constitutional:  Negative for chills, fatigue and fever.  HENT: Positive for congestion (minimal) and sore throat. Negative for ear pain, rhinorrhea and sinus pressure.   Eyes: Negative for redness.  Respiratory: Negative for cough and wheezing.   Gastrointestinal: Negative for abdominal pain, diarrhea, nausea and vomiting.  Genitourinary: Negative for dysuria.  Musculoskeletal: Negative for myalgias and neck stiffness.  Skin: Negative for rash.  Neurological: Positive for headaches.  Hematological: Negative for adenopathy.    Physical Exam Updated Vital Signs BP 127/73 (BP Location: Left Arm)   Pulse 77   Temp 98.3 F (36.8 C) (Oral)   Resp 19   Ht 5\' 7"  (1.702 m)   Wt 86.2 kg   SpO2 98%   BMI 29.76 kg/m   Physical Exam Vitals and nursing note reviewed.  Constitutional:      Appearance: He is well-developed.  HENT:     Head: Normocephalic and atraumatic.     Jaw: No trismus.     Right Ear: Tympanic membrane, ear canal and external ear normal.     Left Ear: Tympanic membrane, ear canal and external ear normal.     Nose: Nose normal. No mucosal edema, congestion or rhinorrhea.     Mouth/Throat:     Mouth: Mucous membranes are not dry.     Pharynx: Uvula midline. Posterior oropharyngeal erythema present.  No oropharyngeal exudate or uvula swelling.     Tonsils: No tonsillar abscesses.  Eyes:     General:        Right eye: No discharge.        Left eye: No discharge.     Conjunctiva/sclera: Conjunctivae normal.  Cardiovascular:     Rate and Rhythm: Normal rate and regular rhythm.     Heart sounds: Normal heart sounds.  Pulmonary:     Effort: Pulmonary effort is normal. No respiratory distress.     Breath sounds: Normal breath sounds. No wheezing or rales.  Abdominal:     Palpations: Abdomen is soft.     Tenderness: There is no abdominal tenderness.  Musculoskeletal:     Cervical back: Normal range of motion and neck supple.  Skin:    General: Skin is warm and dry.  Neurological:       Mental Status: He is alert.     ED Results / Procedures / Treatments   Labs (all labs ordered are listed, but only abnormal results are displayed) Labs Reviewed  GROUP A STREP BY PCR  SARS CORONAVIRUS 2 BY RT PCR (HOSPITAL ORDER, PERFORMED IN [facility])    EKG None  Radiology No results found.  Procedures Procedures (including critical care time)  Medications Ordered in ED Medications - No data to display  ED Course  I have reviewed the triage vital signs and the nursing notes.  Pertinent labs & imaging results that were available during my care of the patient were reviewed by me and considered in my medical decision making (see chart for details).  Patient seen and examined. Work-up initiated.   Vital signs reviewed and are as follows: BP 127/73 (BP Location: Left Arm)   Pulse 77   Temp 98.3 F (36.8 C) (Oral)   Resp 19   Ht 5\' 7"  (1.702 m)   Wt 86.2 kg   SpO2 98%   BMI 29.76 kg/m    Strep neg, patient informed. COVID pending. Counseled on NSAIDs.   Patient counseled on supportive care for viral URI and s/s to return including worsening symptoms, persistent fever, persistent vomiting, or if they have any other concerns. Urged to see PCP if symptoms persist for more than 3 days. Patient verbalizes understanding and agrees with plan.     MDM Rules/Calculators/A&P                          Pt with sore throat -- neg strep. No sign of abscess. COVID pending.  Symptoms suggestive of viral pharyngitis at this point.   Final Clinical Impression(s) / ED Diagnoses Final diagnoses:  Sore throat    Rx / DC Orders ED Discharge Orders    None       , PA-C 07/09/20 1621    Long, 07/11/20, MD 07/11/20 1625

## 2020-07-09 NOTE — Discharge Instructions (Signed)
Please read and follow all provided instructions.  Your diagnoses today include:  1. Sore throat     Tests performed today include:  Strep test: was negative for strep throat  COVID test - pending, I will call if you have a positive result  Vital signs. See below for your results today.   Medications prescribed:  Please use over-the-counter NSAID medications (ibuprofen, naproxen) as directed on the packaging for pain.   Home care instructions:  Please read the educational materials provided and follow any instructions contained in this packet.  Follow-up instructions: Please follow-up with your primary care provider as needed for further evaluation of your symptoms.  Return instructions:   Please return to the Emergency Department if you experience worsening symptoms.   Return if you have worsening problems swallowing, your neck becomes swollen, you cannot swallow your saliva or your voice becomes muffled.   Return with high persistent fever, persistent vomiting, or if you have trouble breathing.   Please return if you have any other emergent concerns.  Additional Information:  Your vital signs today were: BP 127/73 (BP Location: Left Arm)    Pulse 77    Temp 98.3 F (36.8 C) (Oral)    Resp 19    Ht 5\' 7"  (1.702 m)    Wt 86.2 kg    SpO2 98%    BMI 29.76 kg/m  If your blood pressure (BP) was elevated above 135/85 this visit, please have this repeated by your doctor within one month. --------------

## 2020-07-09 NOTE — ED Triage Notes (Signed)
Sore throat and headache x 2 days.  

## 2020-07-09 NOTE — ED Notes (Signed)
Pt discharged to home. Discharge instructions have been discussed with patient and/or family members. Pt verbally acknowledges understanding d/c instructions, and endorses comprehension to checkout at registration before leaving.  °

## 2020-10-07 ENCOUNTER — Encounter (HOSPITAL_BASED_OUTPATIENT_CLINIC_OR_DEPARTMENT_OTHER): Payer: Self-pay | Admitting: Emergency Medicine

## 2020-10-07 ENCOUNTER — Other Ambulatory Visit: Payer: Self-pay

## 2020-10-07 ENCOUNTER — Emergency Department (HOSPITAL_BASED_OUTPATIENT_CLINIC_OR_DEPARTMENT_OTHER)
Admission: EM | Admit: 2020-10-07 | Discharge: 2020-10-07 | Disposition: A | Discharge status: Home / Self Care | Payer: BC Managed Care – PPO | Attending: Emergency Medicine | Admitting: Emergency Medicine

## 2020-10-07 DIAGNOSIS — F17290 Nicotine dependence, other tobacco product, uncomplicated: Secondary | ICD-10-CM | POA: Insufficient documentation

## 2020-10-07 DIAGNOSIS — U071 COVID-19: Secondary | ICD-10-CM

## 2020-10-07 DIAGNOSIS — R030 Elevated blood-pressure reading, without diagnosis of hypertension: Secondary | ICD-10-CM | POA: Insufficient documentation

## 2020-10-07 DIAGNOSIS — R5383 Other fatigue: Secondary | ICD-10-CM | POA: Insufficient documentation

## 2020-10-07 DIAGNOSIS — R509 Fever, unspecified: Secondary | ICD-10-CM | POA: Diagnosis present

## 2020-10-07 DIAGNOSIS — F17210 Nicotine dependence, cigarettes, uncomplicated: Secondary | ICD-10-CM | POA: Insufficient documentation

## 2020-10-07 LAB — RESP PANEL BY RT-PCR (FLU A&B, COVID) ARPGX2
Influenza A by PCR: NEGATIVE
Influenza B by PCR: NEGATIVE
SARS Coronavirus 2 by RT PCR: POSITIVE — AB

## 2020-10-07 NOTE — ED Triage Notes (Signed)
Body aches, mild cough, chills times 1 day.

## 2020-10-07 NOTE — ED Provider Notes (Signed)
[facility] Provider Note: [name], MD, FACEP  CSN: [id number] MRN: [id number] ARRIVAL: 10/07/20 at 0017 ROOM: MH10/MH10   CHIEF COMPLAINT  Generalized Body Aches   HISTORY OF PRESENT ILLNESS  10/07/20 12:32 AM Samuel Schwartz is a 34 y.o. male with a 2-day history of body aches, general fatigue, low-grade fever (to 99.9) and occasional cough.  He denies nasal congestion, sore throat, shortness of breath, nausea, vomiting or diarrhea.  His body aches are most prominent in the chest, worse with breathing or coughing) and lower back.  He rates his pain is a 6 out of 10.   History reviewed. No pertinent past medical history.  Past Surgical History:  Procedure Laterality Date  . OTHER SURGICAL HISTORY     reports surgery on his penis as a child    No family history on file.  Social History   Tobacco Use  . Smoking status: Current Every Day Smoker    Types: Cigarettes, E-cigarettes  . Smokeless tobacco: Never Used  Vaping Use  . Vaping Use: Never used  Substance Use Topics  . Alcohol use: Yes    Alcohol/week: 0.0 standard drinks    Comment: occ  . Drug use: No    Prior to Admission medications   Medication Sig Start Date End Date Taking? Authorizing Provider  fluticasone (FLONASE) 50 MCG/ACT nasal spray Place 2 sprays into both nostrils daily. 01/25/19 07/09/20  Melene Plan, DO  levocetirizine (XYZAL) 5 MG tablet Take 1 tablet (5 mg total) by mouth every evening. 01/19/18 07/09/20  Arthor Captain, PA-C    Allergies Patient has no known allergies.   REVIEW OF SYSTEMS  Negative except as noted here or in the History of Present Illness.   PHYSICAL EXAMINATION  Initial Vital Signs Blood pressure 131/83, pulse 100, temperature 99.3 F (37.4 C), temperature source Oral, resp. rate 20, SpO2 98 %.  Examination General: Well-developed, well-nourished male in no acute distress; appearance consistent with age of record HENT: normocephalic;  atraumatic Eyes: pupils equal, round and reactive to light; extraocular muscles intact Neck: supple Heart: regular rate and rhythm Lungs: clear to auscultation bilaterally Abdomen: soft; nondistended; nontender; bowel sounds present Extremities: No deformity; full range of motion; pulses normal Neurologic: Awake, alert and oriented; motor function intact in all extremities and symmetric; no facial droop Skin: Warm and dry Psychiatric: Normal mood and affect   RESULTS  Summary of this visit's results, reviewed and interpreted by myself:   EKG Interpretation  Date/Time:    Ventricular Rate:    PR Interval:    QRS Duration:   QT Interval:    QTC Calculation:   R Axis:     Text Interpretation:        Laboratory Studies: Results for orders placed or performed during the hospital encounter of 10/07/20 (from the past 24 hour(s))  Resp Panel by RT-PCR (Flu A&B, Covid) Nasopharyngeal Swab     Status: Abnormal   Collection Time: 10/07/20 12:30 AM   Specimen: Nasopharyngeal Swab; Nasopharyngeal(NP) swabs in vial transport medium  Result Value Ref Range   SARS Coronavirus 2 by RT PCR POSITIVE (A) NEGATIVE   Influenza A by PCR NEGATIVE NEGATIVE   Influenza B by PCR NEGATIVE NEGATIVE   Imaging Studies: No results found.  ED COURSE and MDM  Nursing notes, initial and subsequent vitals signs, including pulse oximetry, reviewed and interpreted by myself.  Vitals:   10/07/20 0026  BP: 131/83  Pulse: 100  Resp: 20  Temp:  99.3 F (37.4 C)  TempSrc: Oral  SpO2: 98%   Medications - No data to display  Patient does not meet any criteria for the Regeneron antibody infusion at this time.  He was advised he will need to self quarantine to prevent spread or to return if symptoms are worsening.  PROCEDURES  Procedures   ED DIAGNOSES     ICD-10-CM   1. COVID-19 virus infection  U07.1        Livie Vanderhoof, MD 10/07/20 0126

## 2020-10-08 ENCOUNTER — Telehealth (HOSPITAL_COMMUNITY): Payer: Self-pay | Admitting: Family

## 2020-10-08 ENCOUNTER — Encounter (HOSPITAL_COMMUNITY): Payer: Self-pay | Admitting: Family

## 2020-10-08 DIAGNOSIS — U071 COVID-19: Secondary | ICD-10-CM

## 2020-10-08 NOTE — Telephone Encounter (Signed)
Called to discuss with Samuel Schwartz about Covid symptoms and the use of casirivimab/imdevimab, a combination monoclonal antibody infusion for those with mild to moderate Covid symptoms and at a high risk of hospitalization.     Pt is qualified for this infusion at the infusion center due to co-morbid conditions and/or a member of an at-risk group as he has an elevated SVI score, and and history of smoking up until 1 year ago. He would like to speak with his insurance carrier prior to scheduling treatment, and codes were provided to the patient to do so through my chart portal. Symptoms tier reviewed as well as criteria for ending isolation.  Symptoms reviewed that would warrant ED/Hospital evaluation.      Samuel Wegmann,NP

## 2022-03-31 ENCOUNTER — Emergency Department (HOSPITAL_BASED_OUTPATIENT_CLINIC_OR_DEPARTMENT_OTHER): Payer: BC Managed Care – PPO

## 2022-03-31 ENCOUNTER — Emergency Department (HOSPITAL_BASED_OUTPATIENT_CLINIC_OR_DEPARTMENT_OTHER)
Admission: EM | Admit: 2022-03-31 | Discharge: 2022-04-01 | Disposition: A | Discharge status: Home / Self Care | Payer: BC Managed Care – PPO | Attending: Emergency Medicine | Admitting: Emergency Medicine

## 2022-03-31 ENCOUNTER — Other Ambulatory Visit: Payer: Self-pay

## 2022-03-31 ENCOUNTER — Encounter (HOSPITAL_BASED_OUTPATIENT_CLINIC_OR_DEPARTMENT_OTHER): Payer: Self-pay | Admitting: Emergency Medicine

## 2022-03-31 DIAGNOSIS — B353 Tinea pedis: Secondary | ICD-10-CM | POA: Diagnosis not present

## 2022-03-31 DIAGNOSIS — S93602A Unspecified sprain of left foot, initial encounter: Secondary | ICD-10-CM | POA: Insufficient documentation

## 2022-03-31 DIAGNOSIS — Z87891 Personal history of nicotine dependence: Secondary | ICD-10-CM | POA: Insufficient documentation

## 2022-03-31 DIAGNOSIS — Y990 Civilian activity done for income or pay: Secondary | ICD-10-CM | POA: Insufficient documentation

## 2022-03-31 DIAGNOSIS — S93402A Sprain of unspecified ligament of left ankle, initial encounter: Secondary | ICD-10-CM | POA: Diagnosis not present

## 2022-03-31 DIAGNOSIS — X501XXA Overexertion from prolonged static or awkward postures, initial encounter: Secondary | ICD-10-CM | POA: Diagnosis not present

## 2022-03-31 DIAGNOSIS — S99912A Unspecified injury of left ankle, initial encounter: Secondary | ICD-10-CM | POA: Diagnosis present

## 2022-03-31 MED ORDER — NAPROXEN 375 MG PO TABS: ORAL_TABLET | ORAL | 0 refills | Status: AC

## 2022-03-31 MED ORDER — NAPROXEN 250 MG PO TABS
500.0000 mg | ORAL_TABLET | Freq: Once | ORAL | Status: AC
  Administered 2022-03-31: 500 mg via ORAL
  Filled 2022-03-31: qty 2

## 2022-03-31 NOTE — ED Provider Notes (Addendum)
[facility] Provider Note: Samuel Dell, MD, FACEP  CSN: [id number] MRN: [id number] ARRIVAL: 03/31/22 at 2259 ROOM: MH02/MH02   CHIEF COMPLAINT  Ankle Injury   HISTORY OF PRESENT ILLNESS  03/31/22 11:13 PM Samuel Schwartz is a 36 y.o. male who "rolled" his left ankle at work earlier today.  He is having pain in his left ankle now which she rates as an 8 out of 10, worse with movement or weightbearing.  He took ibuprofen about 2 PM with some relief.  He has not taken anything else since.  The pain is located primarily on the anterolateral aspect of the ankle as well as the base of the left fifth metatarsal.   No past medical history on file.  Past Surgical History:  Procedure Laterality Date   OTHER SURGICAL HISTORY     reports surgery on his penis as a child    No family history on file.  Social History   Tobacco Use   Smoking status: Former    Types: Cigarettes, E-cigarettes   Smokeless tobacco: Never  Vaping Use   Vaping Use: Never used  Substance Use Topics   Alcohol use: Yes    Alcohol/week: 0.0 standard drinks of alcohol    Comment: occ   Drug use: No    Prior to Admission medications   Medication Sig Start Date End Date Taking? Authorizing Provider  naproxen (NAPROSYN) 375 MG tablet Take 1 tablet twice daily as needed for pain. 03/31/22  Yes Sruthi Maurer, MD  fluticasone (FLONASE) 50 MCG/ACT nasal spray Place 2 sprays into both nostrils daily. 01/25/19 07/09/20  Melene Plan, DO  levocetirizine (XYZAL) 5 MG tablet Take 1 tablet (5 mg total) by mouth every evening. 01/19/18 07/09/20  Arthor Captain, PA-C    Allergies Patient has no known allergies.   REVIEW OF SYSTEMS  Negative except as noted here or in the History of Present Illness.   PHYSICAL EXAMINATION  Initial Vital Signs Blood pressure 137/81, pulse 79, temperature 97.8 F (36.6 C), temperature source Oral, resp. rate 18, height 5\' 7"  (1.702 m), weight 97.5 kg, SpO2 99  %.  Examination General: Well-developed, well-nourished male in no acute distress; appearance consistent with age of record HENT: normocephalic; atraumatic Eyes: Normal appearance Neck: supple Heart: regular rate and rhythm Lungs: clear to auscultation bilaterally Abdomen: soft; nondistended; nontender; bowel sounds present Extremities: No deformity; tenderness and swelling at base of left fifth metatarsal Neurologic: Awake, alert and oriented; motor function intact in all extremities and symmetric; no facial droop Skin: Warm and dry; maceration of skin between toes with hyperpigmentation of skin of dorsal toes Psychiatric: Normal mood and affect   RESULTS  Summary of this visit's results, reviewed and interpreted by myself:   EKG Interpretation  Date/Time:    Ventricular Rate:    PR Interval:    QRS Duration:   QT Interval:    QTC Calculation:   R Axis:     Text Interpretation:         Laboratory Studies: No results found for this or any previous visit (from the past 24 hour(s)). Imaging Studies: DG Ankle Complete Left  Result Date: 03/31/2022 CLINICAL DATA:  Trauma to the left lower extremity. EXAM: LEFT ANKLE COMPLETE - 3+ VIEW; LEFT FOOT - COMPLETE 3+ VIEW COMPARISON:  None Available. FINDINGS: There is no evidence of fracture, dislocation, or joint effusion. There is no evidence of arthropathy or other focal bone abnormality. Soft tissues are unremarkable. IMPRESSION: Negative. Electronically Signed  By: Elgie Collard M.D.   On: 03/31/2022 23:40   DG Foot Complete Left  Result Date: 03/31/2022 CLINICAL DATA:  Trauma to the left lower extremity. EXAM: LEFT ANKLE COMPLETE - 3+ VIEW; LEFT FOOT - COMPLETE 3+ VIEW COMPARISON:  None Available. FINDINGS: There is no evidence of fracture, dislocation, or joint effusion. There is no evidence of arthropathy or other focal bone abnormality. Soft tissues are unremarkable. IMPRESSION: Negative. Electronically Signed   By: Elgie Collard M.D.   On: 03/31/2022 23:40    ED COURSE and MDM  Nursing notes, initial and subsequent vitals signs, including pulse oximetry, reviewed and interpreted by myself.  Vitals:   03/31/22 2308 03/31/22 2308  BP:  137/81  Pulse:  79  Resp:  18  Temp:  97.8 F (36.6 C)  TempSrc:  Oral  SpO2:  99%  Weight: 97.5 kg   Height: 5\' 7"  (1.702 m)    Medications  naproxen (NAPROSYN) tablet 500 mg (500 mg Oral Given 03/31/22 2322)   No evidence of bony injury on radiographs.  We will place in a postop shoe and ASO.  The maceration between his toes and hyperpigmentation are consistent with tinea pedis and he was advised to start an over-the-counter antifungal such as Lamisil.   PROCEDURES  Procedures   ED DIAGNOSES     ICD-10-CM   1. Sprain of left ankle, unspecified ligament, initial encounter  S93.402A     2. Sprain of left foot, initial encounter  S93.602A     3. Tinea pedis of both feet  B35.3          Tristram Milian, MD 03/31/22 2345    05/31/22, MD 03/31/22 [phone]

## 2022-03-31 NOTE — ED Triage Notes (Signed)
Reports he rolled his left ankle at work today.  Took ibuprofen around 2 pm with some relief.  Hasn't taken anything else.

## 2022-08-12 ENCOUNTER — Other Ambulatory Visit: Payer: Self-pay

## 2022-08-12 ENCOUNTER — Encounter (HOSPITAL_BASED_OUTPATIENT_CLINIC_OR_DEPARTMENT_OTHER): Payer: Self-pay | Admitting: Emergency Medicine

## 2022-08-12 DIAGNOSIS — Z20822 Contact with and (suspected) exposure to covid-19: Secondary | ICD-10-CM | POA: Diagnosis not present

## 2022-08-12 DIAGNOSIS — G9001 Carotid sinus syncope: Secondary | ICD-10-CM | POA: Insufficient documentation

## 2022-08-12 DIAGNOSIS — M542 Cervicalgia: Secondary | ICD-10-CM | POA: Diagnosis present

## 2022-08-12 NOTE — ED Triage Notes (Signed)
Pt states 2 days hx of sore throat and ear pain on L side. Denies fever, mentions HA. Denies sick contacts.

## 2022-08-13 ENCOUNTER — Emergency Department (HOSPITAL_BASED_OUTPATIENT_CLINIC_OR_DEPARTMENT_OTHER)
Admission: EM | Admit: 2022-08-13 | Discharge: 2022-08-13 | Disposition: A | Discharge status: Home / Self Care | Payer: BC Managed Care – PPO | Attending: Emergency Medicine | Admitting: Emergency Medicine

## 2022-08-13 DIAGNOSIS — G9001 Carotid sinus syncope: Secondary | ICD-10-CM

## 2022-08-13 LAB — RESP PANEL BY RT-PCR (FLU A&B, COVID) ARPGX2
Influenza A by PCR: NEGATIVE
Influenza B by PCR: NEGATIVE
SARS Coronavirus 2 by RT PCR: NEGATIVE

## 2022-08-13 LAB — GROUP A STREP BY PCR: Group A Strep by PCR: NOT DETECTED

## 2022-08-13 NOTE — ED Provider Notes (Signed)
[facility] EMERGENCY DEPARTMENT  Provider Note  CSN: [id number] Arrival date & time: 08/12/22 2321  History Chief Complaint  Patient presents with   Sore Throat    Samuel Schwartz is a 36 y.o. male with no significant PMH reports two days of left neck pain. Not definitely a sore throat and not worse with swallowing but it does go into his L ear. He denies any fever. No other acute complaints.    Home Medications Prior to Admission medications   Medication Sig Start Date End Date Taking? Authorizing Provider  naproxen (NAPROSYN) 375 MG tablet Take 1 tablet twice daily as needed for pain. 03/31/22   Molpus, John, MD  fluticasone (FLONASE) 50 MCG/ACT nasal spray Place 2 sprays into both nostrils daily. 01/25/19 07/09/20  Melene Plan, DO  levocetirizine (XYZAL) 5 MG tablet Take 1 tablet (5 mg total) by mouth every evening. 01/19/18 07/09/20  Arthor Captain, PA-C     Allergies    Patient has no known allergies.   Review of Systems   Review of Systems Please see HPI for pertinent positives and negatives  Physical Exam BP (!) 146/86 (BP Location: Right Arm)   Pulse 69   Temp 98.2 F (36.8 C)   Resp 18   Ht 5\' 7"  (1.702 m)   Wt 93.4 kg   SpO2 99%   BMI 32.26 kg/m   Physical Exam Vitals and nursing note reviewed.  Constitutional:      Appearance: Normal appearance.  HENT:     Head: Normocephalic and atraumatic.     Right Ear: Tympanic membrane normal.     Left Ear: Tympanic membrane normal.     Nose: Nose normal.     Mouth/Throat:     Mouth: Mucous membranes are moist.     Pharynx: No pharyngeal swelling, oropharyngeal exudate or posterior oropharyngeal erythema.     Tonsils: No tonsillar exudate. 0 on the right. 0 on the left.  Eyes:     Extraocular Movements: Extraocular movements intact.     Conjunctiva/sclera: Conjunctivae normal.  Neck:     Comments: Mild tenderness over the left carotid bulb reproduces symptoms Cardiovascular:     Rate and Rhythm:  Normal rate.  Pulmonary:     Effort: Pulmonary effort is normal.     Breath sounds: Normal breath sounds.  Abdominal:     General: Abdomen is flat.     Palpations: Abdomen is soft.     Tenderness: There is no abdominal tenderness.  Musculoskeletal:        General: No swelling. Normal range of motion.     Cervical back: Neck supple.  Lymphadenopathy:     Cervical: No cervical adenopathy.  Skin:    General: Skin is warm and dry.  Neurological:     General: No focal deficit present.     Mental Status: He is alert.  Psychiatric:        Mood and Affect: Mood normal.     ED Results / Procedures / Treatments   EKG None  Procedures Procedures  Medications Ordered in the ED Medications - No data to display  Initial Impression and Plan  Patient here with sore throat/neck pain, has difficulty distinguishing the two, however on exam he has some tenderness over the carotid bulb likely due to carotidynia. His Covid and strep swabs are neg. Discussed the benign and self-limited nature of this problem. Recommend he take NSAIDs for pain, PCP follow up, RTED for any other concerns.  ED Course       MDM Rules/Calculators/A&P Medical Decision Making Problems Addressed: Carotidynia: acute illness or injury  Amount and/or Complexity of Data Reviewed Labs: ordered. Decision-making details documented in ED Course.  Risk OTC drugs.    Final Clinical Impression(s) / ED Diagnoses Final diagnoses:  Carotidynia    Rx / DC Orders ED Discharge Orders     None        Truddie Hidden, MD 08/13/22 [phone]

## 2022-08-13 NOTE — Discharge Instructions (Signed)
Carotidynia is usually a benign inflammation of the blood vessels in your neck. It will typically go away after a few days. Please take motrin as needed for the pain and return to the ED if pain worsens or you have any other concerns.

## 2022-09-05 ENCOUNTER — Emergency Department (HOSPITAL_BASED_OUTPATIENT_CLINIC_OR_DEPARTMENT_OTHER): Payer: BC Managed Care – PPO

## 2022-09-05 ENCOUNTER — Other Ambulatory Visit: Payer: Self-pay

## 2022-09-05 ENCOUNTER — Encounter (HOSPITAL_BASED_OUTPATIENT_CLINIC_OR_DEPARTMENT_OTHER): Payer: Self-pay | Admitting: Emergency Medicine

## 2022-09-05 ENCOUNTER — Emergency Department (HOSPITAL_BASED_OUTPATIENT_CLINIC_OR_DEPARTMENT_OTHER)
Admission: EM | Admit: 2022-09-05 | Discharge: 2022-09-05 | Disposition: A | Discharge status: Home / Self Care | Payer: BC Managed Care – PPO | Attending: Emergency Medicine | Admitting: Emergency Medicine

## 2022-09-05 DIAGNOSIS — R1013 Epigastric pain: Secondary | ICD-10-CM | POA: Insufficient documentation

## 2022-09-05 DIAGNOSIS — R0789 Other chest pain: Secondary | ICD-10-CM | POA: Diagnosis not present

## 2022-09-05 LAB — BASIC METABOLIC PANEL
Anion gap: 7 (ref 5–15)
BUN: 14 mg/dL (ref 6–20)
CO2: 27 mmol/L (ref 22–32)
Calcium: 8.8 mg/dL — ABNORMAL LOW (ref 8.9–10.3)
Chloride: 105 mmol/L (ref 98–111)
Creatinine, Ser: 1.18 mg/dL (ref 0.61–1.24)
GFR, Estimated: >60 mL/min (ref >60)
Glucose, Bld: 99 mg/dL (ref 70–99)
Potassium: 3.8 mmol/L (ref 3.5–5.1)
Sodium: 139 mmol/L (ref 135–145)

## 2022-09-05 LAB — LIPASE, BLOOD: Lipase: 31 U/L (ref 11–51)

## 2022-09-05 LAB — HEPATIC FUNCTION PANEL
ALT: 50 U/L — ABNORMAL HIGH (ref 0–44)
AST: 33 U/L (ref 15–41)
Albumin: 4.3 g/dL (ref 3.5–5.0)
Alkaline Phosphatase: 65 U/L (ref 38–126)
Bilirubin, Direct: 0.1 mg/dL (ref 0.0–0.2)
Indirect Bilirubin: 0.3 mg/dL (ref 0.3–0.9)
Total Bilirubin: 0.4 mg/dL (ref 0.3–1.2)
Total Protein: 7.7 g/dL (ref 6.5–8.1)

## 2022-09-05 LAB — CBC
HCT: 43.9 % (ref 39.0–52.0)
Hemoglobin: 14.3 g/dL (ref 13.0–17.0)
MCH: 26 pg (ref 26.0–34.0)
MCHC: 32.6 g/dL (ref 30.0–36.0)
MCV: 79.8 fL — ABNORMAL LOW (ref 80.0–100.0)
Platelets: 248 10*3/uL (ref 150–400)
RBC: 5.5 MIL/uL (ref 4.22–5.81)
RDW: 14.1 % (ref 11.5–15.5)
WBC: 3.9 10*3/uL — ABNORMAL LOW (ref 4.0–10.5)
nRBC: 0 % (ref 0.0–0.2)

## 2022-09-05 LAB — TROPONIN I (HIGH SENSITIVITY)
Troponin I (High Sensitivity): 5 ng/L (ref <18)
Troponin I (High Sensitivity): 5 ng/L (ref <18)

## 2022-09-05 MED ORDER — ALUM & MAG HYDROXIDE-SIMETH 200-200-20 MG/5ML PO SUSP
30.0000 mL | Freq: Once | ORAL | Status: AC
  Administered 2022-09-05: 30 mL via ORAL
  Filled 2022-09-05: qty 30

## 2022-09-05 MED ORDER — MAALOX MAX 400-400-40 MG/5ML PO SUSP: 10.0000 mL | Freq: Four times a day (QID) | ORAL | 0 refills | Status: AC | PRN

## 2022-09-05 MED ORDER — PANTOPRAZOLE SODIUM 40 MG PO TBEC: 40.0000 mg | DELAYED_RELEASE_TABLET | Freq: Every day | ORAL | 0 refills | Status: AC

## 2022-09-05 NOTE — ED Provider Notes (Signed)
Emergency Department Provider Note   I have reviewed the triage vital signs and the nursing notes.   HISTORY  Chief Complaint Chest Pain   HPI Samuel Schwartz is a 36 y.o. male presents emergency department for evaluation of chest discomfort.  He developed symptoms 2 days prior in the setting of nausea and multiple episodes of vomiting.  He began to have some burning epigastric pain radiating into the chest with some associated pressure.  No shortness of breath.  No pleuritic pain.  No fevers or chills.  Vomiting has stopped.  Mild diarrhea but that has also improved.    History reviewed. No pertinent past medical history.  Review of Systems  Constitutional: No fever/chills Eyes: No visual changes. ENT: No sore throat. Cardiovascular: Positive chest pain. Respiratory: Denies shortness of breath. Gastrointestinal: Positive epigastric abdominal pain.  No nausea, no vomiting.  No diarrhea.  No constipation. Genitourinary: Negative for dysuria. Musculoskeletal: Negative for back pain. Skin: Negative for rash. Neurological: Negative for headaches, focal weakness or numbness.  ____________________________________________   PHYSICAL EXAM:  VITAL SIGNS: ED Triage Vitals  Enc Vitals Group     BP 09/05/22 1843 124/88     Pulse Rate 09/05/22 1843 88     Resp 09/05/22 1843 18     Temp 09/05/22 1843 97.9 F (36.6 C)     Temp src --      SpO2 09/05/22 1843 98 %     Weight 09/05/22 1844 205 lb (93 kg)     Height 09/05/22 1844 5\' 6"  (1.676 m)   Constitutional: Alert and oriented. Well appearing and in no acute distress. Eyes: Conjunctivae are normal.  Head: Atraumatic. Nose: No congestion/rhinnorhea. Mouth/Throat: Mucous membranes are moist.  Neck: No stridor.  Cardiovascular: Normal rate, regular rhythm. Good peripheral circulation. Grossly normal heart sounds.   Respiratory: Normal respiratory effort.  No retractions. Lungs CTAB. Gastrointestinal: Soft and nontender.  No distention.  Musculoskeletal: No lower extremity tenderness nor edema. No gross deformities of extremities. Neurologic:  Normal speech and language. No gross focal neurologic deficits are appreciated.  Skin:  Skin is warm, dry and intact. No rash noted.  ____________________________________________   LABS (all labs ordered are listed, but only abnormal results are displayed)  Labs Reviewed  BASIC METABOLIC PANEL - Abnormal; Notable for the following components:      Result Value   Calcium 8.8 (*)    All other components within normal limits  CBC - Abnormal; Notable for the following components:   WBC 3.9 (*)    MCV 79.8 (*)    All other components within normal limits  HEPATIC FUNCTION PANEL - Abnormal; Notable for the following components:   ALT 50 (*)    All other components within normal limits  LIPASE, BLOOD  TROPONIN I (HIGH SENSITIVITY)  TROPONIN I (HIGH SENSITIVITY)   ____________________________________________  EKG   EKG Interpretation  Date/Time:  Wednesday September 05 2022 18:45:02 EST Ventricular Rate:  81 PR Interval:  164 QRS Duration: 90 QT Interval:  374 QTC Calculation: 434 R Axis:   71 Text Interpretation: Normal sinus rhythm Normal ECG No previous ECGs available Confirmed by 05-06-1993 [phone]) on 09/05/2022 6:49:00 PM        ____________________________________________  RADIOLOGY  DG Chest 2 View  Result Date: 09/05/2022 CLINICAL DATA:  Chest pain EXAM: CHEST - 2 VIEW COMPARISON:  09/22/2017, 08/02/2018 FINDINGS: The heart size and mediastinal contours are within normal limits. Both lungs are clear. The visualized skeletal structures  are unremarkable. IMPRESSION: No active cardiopulmonary disease. Electronically Signed   By: Jasmine Pang M.D.   On: 09/05/2022 19:06    ____________________________________________   PROCEDURES  Procedure(s) performed:   Procedures  None  ____________________________________________   INITIAL  IMPRESSION / ASSESSMENT AND PLAN / ED COURSE  Pertinent labs & imaging results that were available during my care of the patient were reviewed by me and considered in my medical decision making (see chart for details).   This patient is Presenting for Evaluation of CP, which does require a range of treatment options, and is a complaint that involves a high risk of morbidity and mortality.  The Differential Diagnoses includes but is not exclusive to acute coronary syndrome, aortic dissection, pulmonary embolism, cardiac tamponade, community-acquired pneumonia, pericarditis, musculoskeletal chest wall pain, etc.   Critical Interventions-    Medications  alum & mag hydroxide-simeth (MAALOX/MYLANTA) 200-200-20 MG/5ML suspension 30 mL (30 mLs Oral Given 09/05/22 2015)    Reassessment after intervention: Symptoms improved.    Clinical Laboratory Tests Ordered, included troponin negative. Lipase normal. LFTs normal.   Radiologic Tests Ordered, included CXR. I independently interpreted the images and agree with radiology interpretation.   Cardiac Monitor Tracing which shows NSR.    Social Determinants of Health Risk patient is a non-smoker.   Medical Decision Making: Summary:  Patient presents emergency department for evaluation of epigastric pain and chest discomfort.  Chest x-ray without air in the mediastinum to suspect esophageal rupture or tear.  No abnormality in troponin or significant change in EKG.  LFTs and bilirubin are normal.  Reevaluation with update and discussion with patient.  Reviewed lab results and imaging results.  Discussed GI symptom management and close PCP follow-up with strict ED return precaution.  Considered admission but workup is reassuring. Stable for discharge.   Disposition: discharge  ____________________________________________  FINAL CLINICAL IMPRESSION(S) / ED DIAGNOSES  Final diagnoses:  Atypical chest pain  Epigastric pain     NEW  OUTPATIENT MEDICATIONS STARTED DURING THIS VISIT:  Discharge Medication List as of 09/05/2022  9:08 PM     START taking these medications   Details  alum & mag hydroxide-simeth (MAALOX MAX) 400-400-40 MG/5ML suspension Take 10 mLs by mouth every 6 (six) hours as needed for indigestion., Starting Wed 09/05/2022, Normal    pantoprazole (PROTONIX) 40 MG tablet Take 1 tablet (40 mg total) by mouth daily., Starting Wed 09/05/2022, Until Fri 10/05/2022, Normal        Note:  This document was prepared using Dragon voice recognition software and may include unintentional dictation errors.  Alona Bene, MD, [facility] Emergency Medicine    Angelino Rumery, Arlyss Repress, MD 09/07/22 [id number]

## 2022-09-05 NOTE — Discharge Instructions (Signed)

## 2022-09-05 NOTE — ED Triage Notes (Signed)
Central chest pain that started on Monday. States he had a 24 hr stomach bug on Sunday and was vomiting a lot. Chest pain started after virus went away.

## 2022-09-05 NOTE — ED Notes (Signed)
Pt denies chest pain at this time, reports pain comes in waves, denies nausea

## 2022-09-29 ENCOUNTER — Encounter (HOSPITAL_BASED_OUTPATIENT_CLINIC_OR_DEPARTMENT_OTHER): Payer: Self-pay | Admitting: Emergency Medicine

## 2022-09-29 DIAGNOSIS — M5431 Sciatica, right side: Secondary | ICD-10-CM | POA: Diagnosis not present

## 2022-09-29 DIAGNOSIS — M79604 Pain in right leg: Secondary | ICD-10-CM | POA: Diagnosis present

## 2022-09-29 NOTE — ED Triage Notes (Signed)
Pt reports pain from RT buttock radiating to RT posterior thigh since Thanksgiving; no injury

## 2022-09-30 ENCOUNTER — Emergency Department (HOSPITAL_BASED_OUTPATIENT_CLINIC_OR_DEPARTMENT_OTHER)
Admission: EM | Admit: 2022-09-30 | Discharge: 2022-09-30 | Disposition: A | Discharge status: Home / Self Care | Payer: BC Managed Care – PPO | Attending: Emergency Medicine | Admitting: Emergency Medicine

## 2022-09-30 DIAGNOSIS — M5431 Sciatica, right side: Secondary | ICD-10-CM

## 2022-09-30 MED ORDER — LIDOCAINE 5 % EX PTCH
3.0000 | MEDICATED_PATCH | CUTANEOUS | Status: DC
  Administered 2022-09-30: 3 via TRANSDERMAL
  Filled 2022-09-30: qty 3

## 2022-09-30 MED ORDER — IBUPROFEN 800 MG PO TABS
800.0000 mg | ORAL_TABLET | Freq: Once | ORAL | Status: AC
Start: 2022-09-30 — End: 2022-09-30
  Administered 2022-09-30: 800 mg via ORAL
  Filled 2022-09-30: qty 1

## 2022-09-30 MED ORDER — IBUPROFEN 800 MG PO TABS
800.0000 mg | ORAL_TABLET | Freq: Three times a day (TID) | ORAL | 0 refills | Status: AC
Start: 2022-09-30 — End: ?

## 2022-09-30 MED ORDER — LIDOCAINE 5 % EX PTCH: 1.0000 | MEDICATED_PATCH | CUTANEOUS | 0 refills | Status: AC

## 2022-09-30 NOTE — ED Provider Notes (Signed)
[facility] EMERGENCY DEPARTMENT Provider Note   CSN: [id number] Arrival date & time: 09/29/22  2252     History  Chief Complaint  Patient presents with   Leg Pain    Samuel Schwartz is a 36 y.o. male.  The history is provided by the patient.  Leg Pain Location:  Buttock Time since incident:  2 weeks Injury: no   Buttock location:  R buttock Pain details:    Quality:  Shooting   Radiates to:  R leg   Severity:  Moderate   Onset quality:  Sudden   Duration:  2 weeks   Timing:  Constant   Progression:  Unchanged Chronicity:  New Dislocation: no   Relieved by:  Nothing Worsened by:  Nothing Ineffective treatments:  None tried Associated symptoms: no back pain, no decreased ROM and no fatigue   Risk factors: no concern for non-accidental trauma        Home Medications Prior to Admission medications   Medication Sig Start Date End Date Taking? Authorizing Provider  alum & mag hydroxide-simeth (MAALOX MAX) 400-400-40 MG/5ML suspension Take 10 mLs by mouth every 6 (six) hours as needed for indigestion. 09/05/22   Long, Arlyss Repress, MD  naproxen (NAPROSYN) 375 MG tablet Take 1 tablet twice daily as needed for pain. 03/31/22   Molpus, John, MD  pantoprazole (PROTONIX) 40 MG tablet Take 1 tablet (40 mg total) by mouth daily. 09/05/22 10/05/22  Long, Arlyss Repress, MD  fluticasone (FLONASE) 50 MCG/ACT nasal spray Place 2 sprays into both nostrils daily. 01/25/19 07/09/20  Melene Plan, DO  levocetirizine (XYZAL) 5 MG tablet Take 1 tablet (5 mg total) by mouth every evening. 01/19/18 07/09/20  Arthor Captain, PA-C      Allergies    Patient has no known allergies.    Review of Systems   Review of Systems  Constitutional:  Negative for fatigue.  HENT:  Negative for ear discharge.   Eyes:  Negative for redness.  Respiratory:  Negative for shortness of breath, wheezing and stridor.   Gastrointestinal:  Negative for vomiting.  Musculoskeletal:  Positive for arthralgias.  Negative for back pain.  Psychiatric/Behavioral:  Negative for agitation.   All other systems reviewed and are negative.   Physical Exam Updated Vital Signs BP 133/85   Pulse 88   Temp 98 F (36.7 C) (Oral)   Resp 16   Ht 5\' 7"  (1.702 m)   Wt 93 kg   SpO2 98%   BMI 32.11 kg/m  Physical Exam Vitals and nursing note reviewed.  Constitutional:      General: He is not in acute distress.    Appearance: Normal appearance. He is well-developed. He is not diaphoretic.  HENT:     Head: Normocephalic and atraumatic.     Nose: Nose normal.  Eyes:     Conjunctiva/sclera: Conjunctivae normal.     Pupils: Pupils are equal, round, and reactive to light.  Cardiovascular:     Rate and Rhythm: Normal rate and regular rhythm.     Pulses: Normal pulses.     Heart sounds: Normal heart sounds.  Pulmonary:     Effort: Pulmonary effort is normal.     Breath sounds: Normal breath sounds. No wheezing or rales.  Abdominal:     General: Bowel sounds are normal.     Palpations: Abdomen is soft.     Tenderness: There is no abdominal tenderness. There is no guarding or rebound.  Musculoskeletal:  General: Normal range of motion.     Cervical back: Normal, normal range of motion and neck supple.     Thoracic back: Normal.     Lumbar back: Normal.     Right hip: Normal.     Right upper leg: Normal.  Skin:    General: Skin is warm and dry.  Neurological:     Mental Status: He is alert and oriented to person, place, and time.     ED Results / Procedures / Treatments   Labs (all labs ordered are listed, but only abnormal results are displayed) Labs Reviewed - No data to display  EKG None  Radiology No results found.  Procedures Procedures    Medications Ordered in ED Medications  ibuprofen (ADVIL) tablet 800 mg (has no administration in time range)  lidocaine (LIDODERM) 5 % 3 patch (has no administration in time range)    ED Course/ Medical Decision Making/ A&P                            Medical Decision Making Buttock down right leg pain x 2 weeks.    Amount and/or Complexity of Data Reviewed External Data Reviewed: notes.    Details: Previous notes reviewed   Risk Prescription drug management. Risk Details: Symptoms consistent with sciatica.  No trauma.  No indication for imaging at this time.  No NSAIDs taken.  Will start NSAIDs and lidoderm.  Gentle ROM.  Stable for discharge.  Strict return.      Final Clinical Impression(s) / ED Diagnoses Final diagnoses:  None   Return for intractable cough, coughing up blood, fevers > 100.4 unrelieved by medication, shortness of breath, intractable vomiting, chest pain, shortness of breath, weakness, numbness, changes in speech, facial asymmetry, abdominal pain, passing out, Inability to tolerate liquids or food, cough, altered mental status or any concerns. No signs of systemic illness or infection. The patient is nontoxic-appearing on exam and vital signs are within normal limits.  I have reviewed the triage vital signs and the nursing notes. Pertinent labs & imaging results that were available during my care of the patient were reviewed by me and considered in my medical decision making (see chart for details). After history, exam, and medical workup I feel the patient has been appropriately medically screened and is safe for discharge home. Pertinent diagnoses were discussed with the patient. Patient was given return precautions.  Rx / DC Orders ED Discharge Orders     None         Lakeya Mulka, MD 09/30/22 [id number]

## 2023-07-13 ENCOUNTER — Encounter (HOSPITAL_BASED_OUTPATIENT_CLINIC_OR_DEPARTMENT_OTHER): Payer: Self-pay | Admitting: Emergency Medicine

## 2023-07-13 ENCOUNTER — Other Ambulatory Visit: Payer: Self-pay

## 2023-07-13 ENCOUNTER — Emergency Department (HOSPITAL_BASED_OUTPATIENT_CLINIC_OR_DEPARTMENT_OTHER)
Admission: EM | Admit: 2023-07-13 | Discharge: 2023-07-14 | Disposition: A | Discharge status: Home / Self Care | Payer: BC Managed Care – PPO | Attending: Emergency Medicine | Admitting: Emergency Medicine

## 2023-07-13 ENCOUNTER — Emergency Department (HOSPITAL_BASED_OUTPATIENT_CLINIC_OR_DEPARTMENT_OTHER): Payer: BC Managed Care – PPO

## 2023-07-13 DIAGNOSIS — N281 Cyst of kidney, acquired: Secondary | ICD-10-CM | POA: Insufficient documentation

## 2023-07-13 DIAGNOSIS — R7401 Elevation of levels of liver transaminase levels: Secondary | ICD-10-CM | POA: Insufficient documentation

## 2023-07-13 DIAGNOSIS — R7309 Other abnormal glucose: Secondary | ICD-10-CM | POA: Diagnosis not present

## 2023-07-13 DIAGNOSIS — R1030 Lower abdominal pain, unspecified: Secondary | ICD-10-CM

## 2023-07-13 DIAGNOSIS — R197 Diarrhea, unspecified: Secondary | ICD-10-CM | POA: Insufficient documentation

## 2023-07-13 DIAGNOSIS — K760 Fatty (change of) liver, not elsewhere classified: Secondary | ICD-10-CM | POA: Insufficient documentation

## 2023-07-13 DIAGNOSIS — R1031 Right lower quadrant pain: Secondary | ICD-10-CM | POA: Diagnosis present

## 2023-07-13 LAB — COMPREHENSIVE METABOLIC PANEL
ALT: 56 U/L — ABNORMAL HIGH (ref 0–44)
AST: 35 U/L (ref 15–41)
Albumin: 4 g/dL (ref 3.5–5.0)
Alkaline Phosphatase: 52 U/L (ref 38–126)
Anion gap: 11 (ref 5–15)
BUN: 13 mg/dL (ref 6–20)
CO2: 21 mmol/L — ABNORMAL LOW (ref 22–32)
Calcium: 8.5 mg/dL — ABNORMAL LOW (ref 8.9–10.3)
Chloride: 104 mmol/L (ref 98–111)
Creatinine, Ser: 1.03 mg/dL (ref 0.61–1.24)
GFR, Estimated: >60 mL/min (ref >60)
Glucose, Bld: 165 mg/dL — ABNORMAL HIGH (ref 70–99)
Potassium: 3.5 mmol/L (ref 3.5–5.1)
Sodium: 136 mmol/L (ref 135–145)
Total Bilirubin: 0.3 mg/dL (ref 0.3–1.2)
Total Protein: 6.5 g/dL (ref 6.5–8.1)

## 2023-07-13 LAB — URINALYSIS, ROUTINE W REFLEX MICROSCOPIC
Bilirubin Urine: NEGATIVE
Glucose, UA: NEGATIVE mg/dL
Hgb urine dipstick: NEGATIVE
Ketones, ur: NEGATIVE mg/dL
Leukocytes,Ua: NEGATIVE
Nitrite: NEGATIVE
Protein, ur: NEGATIVE mg/dL
Specific Gravity, Urine: >=1.03 (ref 1.005–1.030)
pH: 6 (ref 5.0–8.0)

## 2023-07-13 LAB — CBC WITH DIFFERENTIAL/PLATELET
Abs Immature Granulocytes: 0.02 10*3/uL (ref 0.00–0.07)
Basophils Absolute: 0 10*3/uL (ref 0.0–0.1)
Basophils Relative: 0 %
Eosinophils Absolute: 0.1 10*3/uL (ref 0.0–0.5)
Eosinophils Relative: 2 %
HCT: 44.8 % (ref 39.0–52.0)
Hemoglobin: 14.2 g/dL (ref 13.0–17.0)
Immature Granulocytes: 0 %
Lymphocytes Relative: 43 %
Lymphs Abs: 2.2 10*3/uL (ref 0.7–4.0)
MCH: 25.9 pg — ABNORMAL LOW (ref 26.0–34.0)
MCHC: 31.7 g/dL (ref 30.0–36.0)
MCV: 81.8 fL (ref 80.0–100.0)
Monocytes Absolute: 0.3 10*3/uL (ref 0.1–1.0)
Monocytes Relative: 6 %
Neutro Abs: 2.4 10*3/uL (ref 1.7–7.7)
Neutrophils Relative %: 49 %
Platelets: 239 10*3/uL (ref 150–400)
RBC: 5.48 MIL/uL (ref 4.22–5.81)
RDW: 14.7 % (ref 11.5–15.5)
WBC: 5.1 10*3/uL (ref 4.0–10.5)
nRBC: 0 % (ref 0.0–0.2)

## 2023-07-13 LAB — LIPASE, BLOOD: Lipase: 27 U/L (ref 11–51)

## 2023-07-13 LAB — OCCULT BLOOD X 1 CARD TO LAB, STOOL: Fecal Occult Bld: NEGATIVE

## 2023-07-13 MED ORDER — IOHEXOL 300 MG/ML  SOLN
100.0000 mL | Freq: Once | INTRAMUSCULAR | Status: AC | PRN
  Administered 2023-07-13: 100 mL via INTRAVENOUS

## 2023-07-13 NOTE — ED Triage Notes (Signed)
Pt reports pain to lower abd and RT side; c/o bright red blood with stools x 2 today; reports he does have hemorrhoids

## 2023-07-13 NOTE — ED Provider Notes (Signed)
[place] [facility]ENCY DEPARTMENT AT [facility] Provider Note   CSN: [id number] Arrival date & time: 07/13/23  1608     History {Add pertinent medical, surgical, social history, OB history to HPI:1} Chief Complaint  Patient presents with   Abdominal Pain    Samuel Schwartz is a 37 y.o. male.  The history is provided by the patient.  Abdominal Pain He has no significant past history and comes in because of lower abdominal pain which started yesterday.  Pain started in the right lower abdomen and moved over towards the left lower abdomen.  Today, he has had to loose bowel movements which were red.  He took a dose of loperamide and has not had any more bowel movements.  He denies any nausea or vomiting.  He denies fever or chills.  He has not taken anything else for pain.   Home Medications Prior to Admission medications   Medication Sig Start Date End Date Taking? Authorizing Provider  alum & mag hydroxide-simeth (MAALOX MAX) 400-400-40 MG/5ML suspension Take 10 mLs by mouth every 6 (six) hours as needed for indigestion. 09/05/22   Long, Arlyss Repress, MD  ibuprofen (ADVIL) 800 MG tablet Take 1 tablet (800 mg total) by mouth 3 (three) times daily. 09/30/22   Palumbo, April, MD  lidocaine (LIDODERM) 5 % Place 1 patch onto the skin daily. Remove & Discard patch within 12 hours or as directed by MD 09/30/22   Nicanor Alcon, April, MD  naproxen (NAPROSYN) 375 MG tablet Take 1 tablet twice daily as needed for pain. 03/31/22   Molpus, John, MD  pantoprazole (PROTONIX) 40 MG tablet Take 1 tablet (40 mg total) by mouth daily. 09/05/22 10/05/22  Long, Arlyss Repress, MD  fluticasone (FLONASE) 50 MCG/ACT nasal spray Place 2 sprays into both nostrils daily. 01/25/19 07/09/20  Melene Plan, DO  levocetirizine (XYZAL) 5 MG tablet Take 1 tablet (5 mg total) by mouth every evening. 01/19/18 07/09/20  Arthor Captain, PA-C      Allergies    Patient has no known allergies.    Review of Systems   Review of  Systems  Gastrointestinal:  Positive for abdominal pain.  All other systems reviewed and are negative.   Physical Exam Updated Vital Signs BP (!) 135/95   Pulse 70   Temp 98.1 F (36.7 C)   Resp 20   Ht 5\' 7"  (1.702 m)   Wt 103.4 kg   SpO2 98%   BMI 35.71 kg/m  Physical Exam Vitals and nursing note reviewed.   37 year old male, resting comfortably and in no acute distress. Vital signs are significant for elevated blood pressure. Oxygen saturation is 98%, which is normal. Head is normocephalic and atraumatic. PERRLA, EOMI. Oropharynx is clear. Neck is nontender and supple without adenopathy. Back is nontender and there is no CVA tenderness. Lungs are clear without rales, wheezes, or rhonchi. Chest is nontender. Heart has regular rate and rhythm without murmur. Abdomen is soft, flat, with mild tenderness diffusely.  Maximum tenderness is definitely in the right lower quadrant.  There is no rebound or guarding. Rectal: No hemorrhoids present, normal sphincter tone, no stool present, specimen sent for Hemoccult testing. Extremities have no cyanosis or edema, full range of motion is present. Skin is warm and dry without rash. Neurologic: Mental status is normal, cranial nerves are intact, moves all extremities equally.  ED Results / Procedures / Treatments   Labs (all labs ordered are listed, but only abnormal results are displayed) Labs  Reviewed  COMPREHENSIVE METABOLIC PANEL - Abnormal; Notable for the following components:      Result Value   CO2 21 (*)    Glucose, Bld 165 (*)    Calcium 8.5 (*)    ALT 56 (*)    All other components within normal limits  CBC WITH DIFFERENTIAL/PLATELET - Abnormal; Notable for the following components:   MCH 25.9 (*)    All other components within normal limits  LIPASE, BLOOD  URINALYSIS, ROUTINE W REFLEX MICROSCOPIC    EKG None  Radiology No results found.  Procedures Procedures  {Document cardiac monitor, telemetry assessment  procedure when appropriate:1}  Medications Ordered in ED Medications - No data to display  ED Course/ Medical Decision Making/ A&P   {   Click here for ABCD2, HEART and other calculatorsREFRESH Note before signing :1}                              Medical Decision Making Amount and/or Complexity of Data Reviewed Labs: ordered.   Abdominal pain which is worst in the right lower quadrant concerning for appendicitis.  Differential diagnosis does include diverticulitis, urinary tract infection, viral enteritis.  Red stool without any evidence of any significant bleeding, stool Hemoccult pending.  I have reviewed his laboratory test, my interpretation is elevated random glucose level, normal WBC with normal WBC differential, normal hemoglobin, normal urinalysis.  I have ordered CT of abdomen and pelvis to rule out appendicitis.  {Document critical care time when appropriate:1} {Document review of labs and clinical decision tools ie heart score, Chads2Vasc2 etc:1}  {Document your independent review of radiology images, and any outside records:1} {Document your discussion with family members, caretakers, and with consultants:1} {Document social determinants of health affecting pt's care:1} {Document your decision making why or why not admission, treatments were needed:1} Final Clinical Impression(s) / ED Diagnoses Final diagnoses:  None    Rx / DC Orders ED Discharge Orders     None

## 2023-07-14 NOTE — Discharge Instructions (Addendum)
Your evaluation did not show any sign of anything serious.  You do show evidence of fatty liver, and I recommend that you restrict your consumption of alcohol since that can make your liver worse.  You also have a small cyst on your left kidney which is not significant and needs no further testing.  You may gradually increase your diet over the next 12-24 hours, return to the emergency department if you have any new or concerning symptoms.  Your blood sugar was a little high today.  This will need to be followed as an outpatient.  You should have your blood sugar checked periodically to make sure that you are not in the process of developing diabetes.

## 2024-06-25 ENCOUNTER — Other Ambulatory Visit: Payer: Self-pay

## 2024-06-25 ENCOUNTER — Encounter (HOSPITAL_BASED_OUTPATIENT_CLINIC_OR_DEPARTMENT_OTHER): Payer: Self-pay | Admitting: Emergency Medicine

## 2024-06-25 ENCOUNTER — Emergency Department (HOSPITAL_BASED_OUTPATIENT_CLINIC_OR_DEPARTMENT_OTHER)
Admission: EM | Admit: 2024-06-25 | Discharge: 2024-06-25 | Disposition: A | Discharge status: Home / Self Care | Attending: Emergency Medicine | Admitting: Emergency Medicine

## 2024-06-25 DIAGNOSIS — L299 Pruritus, unspecified: Secondary | ICD-10-CM | POA: Diagnosis not present

## 2024-06-25 DIAGNOSIS — H5712 Ocular pain, left eye: Secondary | ICD-10-CM | POA: Insufficient documentation

## 2024-06-25 MED ORDER — TETRACAINE HCL 0.5 % OP SOLN: 2.0000 [drp] | Freq: Once | OPHTHALMIC | Status: DC

## 2024-06-25 MED ORDER — FLUORESCEIN SODIUM 1 MG OP STRP
1.0000 | ORAL_STRIP | Freq: Once | OPHTHALMIC | Status: AC
  Administered 2024-06-25: 1 via OPHTHALMIC
  Filled 2024-06-25: qty 1

## 2024-06-25 MED ORDER — TETRACAINE HCL 0.5 % OP SOLN
2.0000 [drp] | Freq: Once | OPHTHALMIC | Status: AC
  Administered 2024-06-25: 2 [drp] via OPHTHALMIC
  Filled 2024-06-25: qty 4

## 2024-06-25 MED ORDER — FLUORESCEIN SODIUM 1 MG OP STRP: 1.0000 | ORAL_STRIP | Freq: Once | OPHTHALMIC | Status: DC

## 2024-06-25 NOTE — ED Provider Notes (Signed)
  EMERGENCY DEPARTMENT AT [facility] Provider Note   CSN: [id number] Arrival date & time: 06/25/24  1437     Patient presents with: Eye Pain   Samuel Schwartz is a 38 y.o. male with no significant past medical history who presents to the ED due to left eye pain x 1 day.  Patient states he started developing pruritus and pain to left eye yesterday.  No known injury.  Patient states he used over-the-counter eyedrops with some improvement in itchiness.  He woke up this morning with a little bit of crusting around left eye and has had worsening pain.  No visual changes.  Does not wear contacts or glasses.  No URI symptoms.  Patient states pain is located on the lateral aspect of left eye.  History obtained from patient and past medical records. No interpreter used during encounter.       Prior to Admission medications   Medication Sig Start Date End Date Taking? Authorizing Provider  alum & mag hydroxide-simeth (MAALOX MAX) 400-400-40 MG/5ML suspension Take 10 mLs by mouth every 6 (six) hours as needed for indigestion. 09/05/22   Long, Fonda MATSU, MD  ibuprofen  (ADVIL ) 800 MG tablet Take 1 tablet (800 mg total) by mouth 3 (three) times daily. 09/30/22   Palumbo, April, MD  lidocaine  (LIDODERM ) 5 % Place 1 patch onto the skin daily. Remove & Discard patch within 12 hours or as directed by MD 09/30/22   Nettie, April, MD  naproxen  (NAPROSYN ) 375 MG tablet Take 1 tablet twice daily as needed for pain. 03/31/22   Molpus, John, MD  pantoprazole  (PROTONIX ) 40 MG tablet Take 1 tablet (40 mg total) by mouth daily. 09/05/22 10/05/22  Long, Joshua G, MD  fluticasone  (FLONASE ) 50 MCG/ACT nasal spray Place 2 sprays into both nostrils daily. 01/25/19 07/09/20  Floyd, Dan, DO  levocetirizine (XYZAL ) 5 MG tablet Take 1 tablet (5 mg total) by mouth every evening. 01/19/18 07/09/20  Harris, Abigail, PA-C    Allergies: Patient has no known allergies.    Review of Systems  Eyes:  Positive  for pain, redness and itching. Negative for visual disturbance.    Updated Vital Signs BP 119/79 (BP Location: Left Arm)   Pulse 77   Temp 98.3 F (36.8 C)   Resp 16   Ht 5' 6 (1.676 m)   Wt 106.6 kg   SpO2 98%   BMI 37.93 kg/m   Physical Exam Vitals and nursing note reviewed.  Constitutional:      General: He is not in acute distress.    Appearance: He is not ill-appearing.  HENT:     Head: Normocephalic.  Eyes:     Extraocular Movements: Extraocular movements intact.     Pupils: Pupils are equal, round, and reactive to light.     Comments: Some injection to conjunctiva of left eye. Small discolored dot on iris.   Cardiovascular:     Rate and Rhythm: Normal rate and regular rhythm.     Pulses: Normal pulses.     Heart sounds: Normal heart sounds. No murmur heard.    No friction rub. No gallop.  Pulmonary:     Effort: Pulmonary effort is normal.     Breath sounds: Normal breath sounds.  Abdominal:     General: Abdomen is flat. There is no distension.     Palpations: Abdomen is soft.     Tenderness: There is no abdominal tenderness. There is no guarding or rebound.  Musculoskeletal:  General: Normal range of motion.     Cervical back: Neck supple.  Skin:    General: Skin is warm and dry.  Neurological:     General: No focal deficit present.     Mental Status: He is alert.  Psychiatric:        Mood and Affect: Mood normal.        Behavior: Behavior normal.     (all labs ordered are listed, but only abnormal results are displayed) Labs Reviewed - No data to display  EKG: None  Radiology: No results found.   Procedures   Medications Ordered in the ED  tetracaine  (PONTOCAINE) 0.5 % ophthalmic solution 2 drop (2 drops Both Eyes Given by Other 06/25/24 1623)  fluorescein  ophthalmic strip 1 strip (1 strip Left Eye Given by Other 06/25/24 1623)                                    Medical Decision Making Risk Prescription drug  management.   38 year old male presents to the ED due to left eye pain, erythema, and itchiness that started yesterday.  No known injury.  Denies any visual changes.  Upon arrival, stable vitals.  Patient in no acute distress.  On physical exam patient does have reactive pupils.  Does have a discoloration to left iris.  See photo above.  Patient does not believe this area has been there previously.  Patient had no injury to suggest globe rupture.  Does have some injected conjunctiva to the lateral aspect of left eye.  EOMs intact.  Low suspicion for entrapment.  Unfortunately, the ED does not have a working Tono-Pen however, my suspicion for glaucoma is lower.  Patient had no fluorescein  uptake on exam.  20/20 vision bilaterally. Discussed with Dr. Maree with ophthalmology who will see patient tomorrow at noon in his office.  Shared decision making in regards to prescribing antibiotic eyedrops versus waiting for ophthalmology examination tomorrow.  Patient prefers to wait for ophthalmology tomorrow.  Patient stable for discharge. Strict ED precautions discussed with patient. Patient states understanding and agrees to plan. Patient discharged home in no acute distress and stable vitals  No PCP No PMH    Final diagnoses:  Left eye pain    ED Discharge Orders     None          Lorelle Aleck JAYSON DEVONNA 06/25/24 1732    Darra Fonda MATSU, MD 06/25/24 2253

## 2024-06-25 NOTE — Discharge Instructions (Addendum)
 It was a pleasure taking care of you today.  As discussed, I spoke with the eye doctor and he will see you tomorrow around 12/ 12:30.  Report to the office at that time.  Return to the ER for any worsening symptoms.

## 2024-06-25 NOTE — ED Triage Notes (Addendum)
 Pt reports LT eye pain, redness, itching and tearing since yesterday morning, denies any injury or trauma to the eye, does not wear glasses or contacts, denies vision changes

## 2024-06-25 NOTE — ED Notes (Signed)
 Pt alert and oriented X 4 at the time of discharge. RR even and unlabored. No acute distress noted. Pt verbalized understanding of discharge instructions as discussed. Pt ambulatory to lobby at time of discharge.

## 2024-08-27 ENCOUNTER — Emergency Department (HOSPITAL_BASED_OUTPATIENT_CLINIC_OR_DEPARTMENT_OTHER)
Admission: EM | Admit: 2024-08-27 | Discharge: 2024-08-27 | Disposition: A | Discharge status: Home / Self Care | Attending: Emergency Medicine | Admitting: Emergency Medicine

## 2024-08-27 ENCOUNTER — Other Ambulatory Visit: Payer: Self-pay

## 2024-08-27 ENCOUNTER — Encounter (HOSPITAL_BASED_OUTPATIENT_CLINIC_OR_DEPARTMENT_OTHER): Payer: Self-pay | Admitting: Emergency Medicine

## 2024-08-27 DIAGNOSIS — L02211 Cutaneous abscess of abdominal wall: Secondary | ICD-10-CM | POA: Insufficient documentation

## 2024-08-27 DIAGNOSIS — L0291 Cutaneous abscess, unspecified: Secondary | ICD-10-CM

## 2024-08-27 NOTE — ED Triage Notes (Signed)
 Pt presents with small punctate area under navel that has been there about a week, progressively larger and more painful. Has not drained at home.  Is irritated as it is where his waistband is.

## 2024-08-27 NOTE — ED Notes (Signed)
 Pt transferred from [facility] to ED RM 13. Assuming pt care at this time.

## 2024-08-27 NOTE — ED Provider Notes (Signed)
 [place] [facility] Provider Note   CSN: [id number] Arrival date & time: 08/27/24  1737     Patient presents with: Abscess   Samuel Schwartz is a 38 y.o. male patient who presents to the emergency department today for further evaluation of an abscess to the suprapubic region.  It has been progressively worsening over the last week or so.  Patient first noticed it in the shower and has been getting bigger.  Actively draining.  Denies any fever or chills.    Abscess      Prior to Admission medications   Medication Sig Start Date End Date Taking? Authorizing Provider  alum & mag hydroxide-simeth (MAALOX MAX) 400-400-40 MG/5ML suspension Take 10 mLs by mouth every 6 (six) hours as needed for indigestion. 09/05/22   Long, Fonda MATSU, MD  ibuprofen  (ADVIL ) 800 MG tablet Take 1 tablet (800 mg total) by mouth 3 (three) times daily. 09/30/22   Palumbo, April, MD  lidocaine  (LIDODERM ) 5 % Place 1 patch onto the skin daily. Remove & Discard patch within 12 hours or as directed by MD 09/30/22   Nettie, April, MD  naproxen  (NAPROSYN ) 375 MG tablet Take 1 tablet twice daily as needed for pain. 03/31/22   Molpus, John, MD  pantoprazole  (PROTONIX ) 40 MG tablet Take 1 tablet (40 mg total) by mouth daily. 09/05/22 10/05/22  LongFonda MATSU, MD  fluticasone  (FLONASE ) 50 MCG/ACT nasal spray Place 2 sprays into both nostrils daily. 01/25/19 07/09/20  Floyd, Dan, DO  levocetirizine (XYZAL ) 5 MG tablet Take 1 tablet (5 mg total) by mouth every evening. 01/19/18 07/09/20  Harris, Abigail, PA-C    Allergies: Patient has no known allergies.    Review of Systems  All other systems reviewed and are negative.   Updated Vital Signs BP 132/80 (BP Location: Right Arm)   Pulse 70   Temp 98.1 F (36.7 C) (Oral)   Resp 16   Ht 5' 7 (1.702 m)   Wt 106.6 kg   SpO2 100%   BMI 36.81 kg/m   Physical Exam Vitals and nursing note reviewed.  Constitutional:      Appearance:  Normal appearance.  HENT:     Head: Normocephalic and atraumatic.  Eyes:     General:        Right eye: No discharge.        Left eye: No discharge.     Conjunctiva/sclera: Conjunctivae normal.  Pulmonary:     Effort: Pulmonary effort is normal.  Skin:    General: Skin is warm and dry.     Findings: No rash.     Comments: 2 cm draining abscess right the suprapubic region at the waistline.  Neurological:     General: No focal deficit present.     Mental Status: He is alert.  Psychiatric:        Mood and Affect: Mood normal.        Behavior: Behavior normal.     (all labs ordered are listed, but only abnormal results are displayed) Labs Reviewed - No data to display  EKG: None  Radiology: No results found.   .Incision and Drainage  Date/Time: 08/27/2024 7:49 PM  Performed by: Theotis Cameron HERO, PA-C Authorized by: Theotis Cameron HERO, PA-C   Consent:    Consent obtained:  Verbal   Consent given by:  Patient   Risks discussed:  Bleeding Universal protocol:    Procedure explained and questions answered to patient or proxy's  satisfaction: yes     Relevant documents present and verified: yes     Test results available : yes     Imaging studies available: yes     Required blood products, implants, devices, and special equipment available: yes     Site/side marked: yes     Immediately prior to procedure, a time out was called: yes     Patient identity confirmed:  Arm band and verbally with patient Location:    Type:  Abscess   Size:  2cm   Location:  Trunk   Trunk location:  Abdomen Pre-procedure details:    Skin preparation:  Povidone-iodine Sedation:    Sedation type:  None Anesthesia:    Anesthesia method:  None Procedure type:    Complexity:  Simple Procedure details:    Needle aspiration: yes     Needle size:  18 G   Drainage:  Purulent   Drainage amount:  Moderate   Wound treatment:  Wound left open   Packing materials:  None Post-procedure details:     Procedure completion:  Tolerated well, no immediate complications    Medications Ordered in the ED - No data to display   Medical Decision Making CORA STETSON is a 38 y.o. male patient who presents to the emergency department today for further evaluation of an abscess.  This is actively draining.  I opened up the second area with an 18-gauge needle.  Please see procedure note above.  Was able to get out all purulent material.  Left wound open.  Encouraged warm compresses.  Patient is safe for discharge at this time.  Strict return precautions were discussed. Vital signs normal.       Final diagnoses:  Abscess    ED Discharge Orders     None          Theotis Cameron HERO, [place] 08/27/24 1954    Patt Alm Macho, MD 08/27/24 2049

## 2024-08-27 NOTE — Discharge Instructions (Signed)
 Please use warm compresses 4 times per day.  You can use ibuprofen  600 mg every 6 hours as needed for pain.  Allow the material to drain normally.

## 2024-09-26 ENCOUNTER — Emergency Department (HOSPITAL_BASED_OUTPATIENT_CLINIC_OR_DEPARTMENT_OTHER)
Admission: EM | Admit: 2024-09-26 | Discharge: 2024-09-26 | Disposition: A | Discharge status: Home / Self Care | Attending: Emergency Medicine | Admitting: Emergency Medicine

## 2024-09-26 ENCOUNTER — Encounter (HOSPITAL_BASED_OUTPATIENT_CLINIC_OR_DEPARTMENT_OTHER): Payer: Self-pay | Admitting: Urology

## 2024-09-26 DIAGNOSIS — M545 Low back pain, unspecified: Secondary | ICD-10-CM | POA: Diagnosis present

## 2024-09-26 MED ORDER — CYCLOBENZAPRINE HCL 10 MG PO TABS: 10.0000 mg | ORAL_TABLET | Freq: Two times a day (BID) | ORAL | 0 refills | Status: AC | PRN

## 2024-09-26 NOTE — Discharge Instructions (Addendum)
 It was a pleasure meeting with you today.  Your symptoms are likely due to a muscle strain.  I have sent a short course of muscle relaxers to your pharmacy.  You can also take over-the-counter medications for pain as needed.  If you continue to have this low back pain I recommend following up with orthopedics for ongoing management.  I have included information for a local orthopedic office.  Please return if pain worsens or you develop any other concerning symptoms.  Please use Tylenol  or ibuprofen  for pain.  You may use 600 mg ibuprofen  every 6 hours or 1000 mg of Tylenol  every 6 hours.  You may choose to alternate between the 2.  This would be most effective.  Not to exceed 4 g of Tylenol  within 24 hours.  Not to exceed 3200 mg ibuprofen  24 hours.

## 2024-09-26 NOTE — ED Triage Notes (Signed)
 Pt states lower back pain that started this am while sitting down , leaned forward and sneezed and back started hurting  Pain worse with ROM and bending

## 2024-09-26 NOTE — ED Provider Notes (Signed)
 [place] [facility] Provider Note   CSN: [id number] Arrival date & time: 09/26/24  2152     Patient presents with: Back Pain   Samuel Schwartz is a 38 y.o. male.   Patient is here for evaluation of low back pain that began earlier today.  He states he was having breakfast and leaned down then sneezed when the low back pain began.  He took 4 Tylenol  as well as a muscle relaxer tizanidine given to him by his mom.  These relieved the pain somewhat and he was able to go about his day.  After laying down for a nap he woke up and felt very stiff with the pain being worse so he came here for further evaluation.  He denies any numbness or tingling of the lower extremities.  He denies any saddle anesthesia.  He denies any urinary or fecal incontinence.  He has been able to use the bathroom normally.  Patient requesting work note for tomorrow.  The history is provided by the patient.  Back Pain      Prior to Admission medications   Medication Sig Start Date End Date Taking? Authorizing Provider  alum & mag hydroxide-simeth (MAALOX MAX) 400-400-40 MG/5ML suspension Take 10 mLs by mouth every 6 (six) hours as needed for indigestion. 09/05/22   Long, Fonda MATSU, MD  ibuprofen  (ADVIL ) 800 MG tablet Take 1 tablet (800 mg total) by mouth 3 (three) times daily. 09/30/22   Palumbo, April, MD  lidocaine  (LIDODERM ) 5 % Place 1 patch onto the skin daily. Remove & Discard patch within 12 hours or as directed by MD 09/30/22   Nettie, April, MD  naproxen  (NAPROSYN ) 375 MG tablet Take 1 tablet twice daily as needed for pain. 03/31/22   Molpus, John, MD  pantoprazole  (PROTONIX ) 40 MG tablet Take 1 tablet (40 mg total) by mouth daily. 09/05/22 10/05/22  LongFonda MATSU, MD  fluticasone  (FLONASE ) 50 MCG/ACT nasal spray Place 2 sprays into both nostrils daily. 01/25/19 07/09/20  Floyd, Dan, DO  levocetirizine (XYZAL ) 5 MG tablet Take 1 tablet (5 mg total) by mouth every evening.  01/19/18 07/09/20  Harris, Abigail, PA-C    Allergies: Patient has no known allergies.    Review of Systems  Musculoskeletal:  Positive for back pain.    Updated Vital Signs BP (!) 146/91 (BP Location: Right Arm)   Pulse 84   Temp 98.8 F (37.1 C) (Oral)   Resp 18   Ht 5' 7 (1.702 m)   Wt 106 kg   SpO2 99%   BMI 36.60 kg/m   Physical Exam Vitals and nursing note reviewed.  Constitutional:      General: He is not in acute distress.    Appearance: Normal appearance. He is not ill-appearing, toxic-appearing or diaphoretic.  HENT:     Head: Normocephalic and atraumatic.  Eyes:     General: No scleral icterus.    Extraocular Movements: Extraocular movements intact.     Conjunctiva/sclera: Conjunctivae normal.  Pulmonary:     Effort: Pulmonary effort is normal. No respiratory distress.  Musculoskeletal:        General: Tenderness present. No deformity. Normal range of motion.     Comments: Tenderness to the right lumbar region with associated muscle tightness.  Equal strength of bilateral lower extremities.  Skin:    General: Skin is warm and dry.     Coloration: Skin is not jaundiced or pale.  Neurological:  Mental Status: He is alert and oriented to person, place, and time.     (all labs ordered are listed, but only abnormal results are displayed) Labs Reviewed - No data to display  EKG: None  Radiology: No results found.  Procedures   Medications Ordered in the ED - No data to display   Patient presents to the ED for concern of right sided lumbar back pain, this involves an extensive number of treatment options, and is a complaint that carries with it a high risk of complications and morbidity.  The differential diagnosis includes muscle strain, soft tissue injury, shingles, cauda equina, malignancy, or spinal abscess.  Considering patient's HPI there is a high suspicion for muscle strain.  I do not suspect cauda equina as the patient does not have numbness  or tingling of the extremities, urinary or fecal incontinence, or saddle anesthesia.  New onset of symptoms errs us  away from malignancy or spinal abscess.  As well as no constitutional symptoms.  Problem List / ED Course:  Right-sided lumbar back pain.  Based on HPI and physical exam with reproducible pain with palpation this is likely muscle strain.  I do not feel imaging is necessary at this time.  Stable for discharge.   Reevaluation:  After the interventions noted above, I reevaluated the patient and found that they have :stayed the same   Dispostion:  After consideration of the diagnostic results and the patients response to treatment, I feel that the patent would benefit from supportive care in the home setting with over-the-counter pain medication and short course of prescribed muscle relaxers.  Encouraged to follow-up with orthopedics if pain persists past 1 to 2 weeks.  Return precautions given.   Medical Decision Making Risk Prescription drug management.   This note was produced using [employer] Engineer. While the provider has reviewed and verified all clinical information, transcription errors may remain.    Final diagnoses:  None    ED Discharge Orders     None          Rosina Almarie LABOR, PA-C 09/27/24 [id number]
